# Patient Record
Sex: Female | Born: 1985 | Race: White | Hispanic: No | Marital: Married | Smoking: Never smoker
Health system: Southern US, Community
[De-identification: ages and names within clinical notes are randomized; demographics above are authoritative.]

## PROBLEM LIST (undated history)

## (undated) DIAGNOSIS — Z87442 Personal history of urinary calculi: Secondary | ICD-10-CM

## (undated) DIAGNOSIS — F419 Anxiety disorder, unspecified: Secondary | ICD-10-CM

## (undated) HISTORY — PX: OTHER SURGICAL HISTORY: SHX169

---

## 2006-01-05 ENCOUNTER — Other Ambulatory Visit: Admission: RE | Admit: 2006-01-05 | Discharge: 2006-01-05 | Payer: Self-pay | Admitting: Obstetrics and Gynecology

## 2006-11-27 ENCOUNTER — Emergency Department (HOSPITAL_COMMUNITY): Admission: EM | Admit: 2006-11-27 | Discharge: 2006-11-27 | Payer: Self-pay | Admitting: Emergency Medicine

## 2009-02-09 ENCOUNTER — Ambulatory Visit: Payer: Self-pay | Admitting: Internal Medicine

## 2019-11-26 ENCOUNTER — Emergency Department (HOSPITAL_COMMUNITY): Payer: BC Managed Care – PPO

## 2019-11-26 ENCOUNTER — Emergency Department (HOSPITAL_COMMUNITY)
Admission: EM | Admit: 2019-11-26 | Discharge: 2019-11-26 | Disposition: A | Discharge status: Home / Self Care | Payer: BC Managed Care – PPO | Attending: Emergency Medicine | Admitting: Emergency Medicine

## 2019-11-26 ENCOUNTER — Encounter (HOSPITAL_COMMUNITY): Payer: Self-pay | Admitting: Emergency Medicine

## 2019-11-26 ENCOUNTER — Other Ambulatory Visit: Payer: Self-pay

## 2019-11-26 DIAGNOSIS — Z20828 Contact with and (suspected) exposure to other viral communicable diseases: Secondary | ICD-10-CM | POA: Diagnosis not present

## 2019-11-26 DIAGNOSIS — R0789 Other chest pain: Secondary | ICD-10-CM | POA: Insufficient documentation

## 2019-11-26 DIAGNOSIS — R519 Headache, unspecified: Secondary | ICD-10-CM | POA: Diagnosis not present

## 2019-11-26 DIAGNOSIS — R079 Chest pain, unspecified: Secondary | ICD-10-CM | POA: Diagnosis not present

## 2019-11-26 LAB — BASIC METABOLIC PANEL
Anion gap: 11 (ref 5–15)
BUN: 13 mg/dL (ref 6–20)
CO2: 23 mmol/L (ref 22–32)
Calcium: 9.6 mg/dL (ref 8.9–10.3)
Chloride: 105 mmol/L (ref 98–111)
Creatinine, Ser: 0.74 mg/dL (ref 0.44–1.00)
GFR calc Af Amer: >60 mL/min (ref >60)
GFR calc non Af Amer: >60 mL/min (ref >60)
Glucose, Bld: 109 mg/dL — ABNORMAL HIGH (ref 70–99)
Potassium: 4.1 mmol/L (ref 3.5–5.1)
Sodium: 139 mmol/L (ref 135–145)

## 2019-11-26 LAB — TROPONIN I (HIGH SENSITIVITY)
Troponin I (High Sensitivity): <2 ng/L (ref <18)
Troponin I (High Sensitivity): <2 ng/L (ref <18)

## 2019-11-26 LAB — I-STAT BETA HCG BLOOD, ED (MC, WL, AP ONLY): I-stat hCG, quantitative: <5 m[IU]/mL (ref <5)

## 2019-11-26 LAB — CBC
HCT: 39.4 % (ref 36.0–46.0)
Hemoglobin: 13.9 g/dL (ref 12.0–15.0)
MCH: 29.6 pg (ref 26.0–34.0)
MCHC: 35.3 g/dL (ref 30.0–36.0)
MCV: 84 fL (ref 80.0–100.0)
Platelets: 270 10*3/uL (ref 150–400)
RBC: 4.69 MIL/uL (ref 3.87–5.11)
RDW: 11.9 % (ref 11.5–15.5)
WBC: 11.4 10*3/uL — ABNORMAL HIGH (ref 4.0–10.5)
nRBC: 0 % (ref 0.0–0.2)

## 2019-11-26 LAB — D-DIMER, QUANTITATIVE (NOT AT ARMC): D-Dimer, Quant: <0.27 ug/mL-FEU (ref 0.00–0.50)

## 2019-11-26 MED ORDER — SODIUM CHLORIDE 0.9% FLUSH: 3.0000 mL | Freq: Once | INTRAVENOUS | Status: DC

## 2019-11-26 MED ORDER — HYDROXYZINE HCL 25 MG PO TABS: 25.0000 mg | ORAL_TABLET | Freq: Four times a day (QID) | ORAL | 0 refills | Status: AC

## 2019-11-26 NOTE — Discharge Instructions (Addendum)
Please take medication as prescribed. Please follow up with neurology by calling to make appointment (may call today). I have also included some resources for therapy in your discharge paperwork. Please call to schedule an appointment if you would like. Please return immediately to ED for new or worsening or concerning symptoms.

## 2019-11-26 NOTE — ED Triage Notes (Signed)
Pt to ED with c/o mid chest pain radiating into left shoulder x's 4 days.  Pt denies any nausea or vomiting

## 2019-11-26 NOTE — ED Provider Notes (Signed)
[facility]   CSN: [id number] Arrival date & time: 11/26/19  0134     History   Chief Complaint Chief Complaint  Patient presents with   Chest Pain    HPI Shelbie Ammonsnnie Van Hoy Gatton is a 33 y.o. female      HPI  Patient is an otherwise healthy 33 year old female presenting today with intermittent chest pain that is described as sharp and tight with mild severity, states that it typically occurs more when she is sitting still when she is walking around.  But can happen anytime.  States it is worse with bending over.  Denies any cough congestion fevers.  Denies any shortness of breath.   Patient also endorses intermittent headaches as well.  States that she has had some moments of decreased sensation in her left arm.  Denies any vision changes, weakness, slurred speech, facial droop.  Denies any trauma to her head or neck.  Denies any medications, drugs, alcohol use.  Denies any fevers, confusion or neck stiffness.  Denies any decreased temperature or pain sensation.  Denies any electric shock sensation with neck flexion.  Denies any double vision.  Denies constipation, incontinence, or worsening of symptoms with heat.  Patient is a non-smoker, does endorse history of cardiac issues in family members at young age but is uncertain of specific age.  Denies any personal history of diabetes, hypertension, hyperlipidemia, obesity.  History reviewed. No pertinent past medical history.  There are no active problems to display for this patient.   History reviewed. No pertinent surgical history.   OB History   No obstetric history on file.      Home Medications    Prior to Admission medications   Medication Sig Start Date End Date Taking? Authorizing Provider  hydrOXYzine (ATARAX/VISTARIL) 25 MG tablet Take 1 tablet (25 mg total) by mouth every 6 (six) hours for 14 days. 11/26/19 12/10/19  Gailen ShelterFondaw, Devynne Sturdivant S, PA    Family History No  family history on file.  Social History Social History   Tobacco Use   Smoking status: Never Smoker   Smokeless tobacco: Never Used  Substance Use Topics   Alcohol use: Never    Frequency: Never   Drug use: Never     Allergies   Patient has no allergy information on record.   Review of Systems Review of Systems  Constitutional: Negative for chills and fever.  HENT: Negative for congestion.   Respiratory: Negative for chest tightness and shortness of breath.   Cardiovascular: Positive for chest pain.  Gastrointestinal: Negative for abdominal pain.  Genitourinary: Negative for frequency.  Musculoskeletal: Negative for back pain, neck pain and neck stiffness.  Neurological: Positive for headaches.       Paresthesias     Physical Exam Updated Vital Signs BP 115/81    Pulse 99    Temp 98.7 F (37.1 C) (Oral)    Resp 18    Ht 4\' 11"  (1.499 m)    Wt 65.8 kg    LMP 11/05/2019 (Approximate)    SpO2 98%    BMI 29.29 kg/m   Physical Exam Vitals signs and nursing note reviewed.  Constitutional:      General: She is not in acute distress.    Comments: Patient tearful in room during encounter.  Able to follow commands answers questions appropriately.  Alert and oriented x3  HENT:     Head: Normocephalic and atraumatic.     Nose: Nose normal.  Eyes:  General: No scleral icterus. Neck:     Musculoskeletal: Normal range of motion. No neck rigidity.  Cardiovascular:     Rate and Rhythm: Normal rate and regular rhythm.     Pulses: Normal pulses.     Heart sounds: Normal heart sounds.     Comments: Heart rate within normal limits Pulmonary:     Effort: Pulmonary effort is normal. No respiratory distress.     Breath sounds: No wheezing.  Abdominal:     Palpations: Abdomen is soft.     Tenderness: There is no abdominal tenderness.  Musculoskeletal:     Right lower leg: No edema.     Left lower leg: No edema.  Skin:    General: Skin is warm and dry.     Capillary  Refill: Capillary refill takes less than 2 seconds.  Neurological:     Mental Status: She is alert. Mental status is at baseline.     Comments: Alert and oriented to self, place, time and event.   Speech is fluent, clear without dysarthria or dysphasia.   Strength 5/5 in upper/lower extremities  Sensation intact in upper/lower extremities   Normal gait.  Negative Romberg. No pronator drift.  Normal finger-to-nose and feet tapping.  CN I not tested  CN II grossly intact visual fields bilaterally. Did not visualize posterior eye.   CN III, IV, VI PERRLA and EOMs intact bilaterally  CN V Intact sensation to sharp and light touch to the face  CN VII facial movements symmetric  CN VIII not tested  CN IX, X no uvula deviation, symmetric rise of soft palate  CN XI 5/5 SCM and trapezius strength bilaterally  CN XII Midline tongue protrusion, symmetric L/R movements   Psychiatric:        Thought Content: Thought content normal.        Judgment: Judgment normal.     Comments: Anxious and upset.      ED Treatments / Results  Labs (all labs ordered are listed, but only abnormal results are displayed) Labs Reviewed  BASIC METABOLIC PANEL - Abnormal; Notable for the following components:      Result Value   Glucose, Bld 109 (*)    All other components within normal limits  CBC - Abnormal; Notable for the following components:   WBC 11.4 (*)    All other components within normal limits  SARS CORONAVIRUS 2 (TAT 6-24 HRS)  D-DIMER, QUANTITATIVE (NOT AT [facility])  I-STAT BETA HCG BLOOD, ED ([place], [facility], [facility] ONLY)  TROPONIN I (HIGH SENSITIVITY)  TROPONIN I (HIGH SENSITIVITY)    EKG EKG Interpretation  Date/Time:  Wednesday November 26 2019 01:42:05 EST Ventricular Rate:  118 PR Interval:  128 QRS Duration: 74 QT Interval:  300 QTC Calculation: 420 R Axis:   70 Text Interpretation: Sinus tachycardia Otherwise normal ECG No old tracing to compare Confirmed by Deno Etienne [phone]) on  11/26/2019 8:27:18 AM   Radiology Dg Chest 2 View  Result Date: 11/26/2019 CLINICAL DATA:  Chest pain EXAM: CHEST - 2 VIEW COMPARISON:  None. FINDINGS: The heart size and mediastinal contours are within normal limits. Both lungs are clear. The visualized skeletal structures are unremarkable. IMPRESSION: No active cardiopulmonary disease. Electronically Signed   By: Constance Holster M.D.   On: 11/26/2019 02:08    Procedures Procedures (including critical care time)  Medications Ordered in ED Medications  sodium chloride flush (NS) 0.9 % injection 3 mL (has no administration in time range)  Initial Impression / Assessment and Plan / ED Course  I have reviewed the triage vital signs and the nursing notes.  Pertinent labs & imaging results that were available during my care of the patient were reviewed by me and considered in my medical decision making (see chart for details).        Patient presents with sternal chest pain radiating to left shoulder is initially presents to ED tachycardic and hypertensive however pulse and blood pressure normalized without any intervention before being assessed by EDP.   Patient with atypical chest pain that is intermittent.  She has normal EKG other than sinus tachycardia and troponins that are negative, less than 2, x2.  chest X-ray personally reviewed by me is without acute abnormality.   Bloodwork without acute abnormality. Mild leukocytosis that is unremarkable without left shift. D-dimer negative. Doubt PE, will not order CTA at this time. Doubt ACS as pt has low heart score and with non-ischemic EKG/trops WNLs.   Mild concern for neurologic cause of patient's symptoms versus psychologic stress related to recent job issues, home life and death of family dog. Will recommend FU with neurology as patient states she has not been able to get seen by family medicine recently and has an apt scheduled in march. Will prescribe atarax for anxiety and  recommend outpatient resources for therapy.   Gave strict return precautions. Will obtain OP covid swab.     I discussed this case with my attending physician who cosigned this note including patient's presenting symptoms, physical exam, and planned diagnostics and interventions. Attending physician stated agreement with plan or made changes to plan which were implemented.   This patient appears reasonably screened and I doubt any other medical condition requiring further workup, evaluation, or treatment in the ED at this time prior to discharge.   Patient's vitals are WNL apart from vital sign abnormalities discussed above, patient is in NAD, and able to ambulate in the ED at their baseline. Pain has been managed or a plan has been made for home management and has no complaints prior to discharge. Patient is comfortable with above plan and is stable for discharge at this time. All questions were answered prior to disposition. Results from the ER workup discussed with the patient face to face and all questions answered to the best of my ability. The patient is safe for discharge with strict return precautions. Patient appears safe for discharge with appropriate follow-up. Conveyed my impression with the patient and they voiced understanding and are agreeable to plan.   An After Visit Summary was printed and given to the patient.  Portions of this note were generated with Scientist, clinical (histocompatibility and immunogenetics). Dictation errors may occur despite best attempts at proofreading.    Final Clinical Impressions(s) / ED Diagnoses   Final diagnoses:  Atypical chest pain    ED Discharge Orders         Ordered    hydrOXYzine (ATARAX/VISTARIL) 25 MG tablet  Every 6 hours     11/26/19 0852           Gailen Shelter, PA 11/26/19 0853    Melene Plan, DO 11/26/19 [phone]

## 2019-12-31 DIAGNOSIS — F430 Acute stress reaction: Secondary | ICD-10-CM | POA: Diagnosis not present

## 2019-12-31 DIAGNOSIS — E663 Overweight: Secondary | ICD-10-CM | POA: Diagnosis not present

## 2020-02-24 DIAGNOSIS — E663 Overweight: Secondary | ICD-10-CM | POA: Diagnosis not present

## 2020-02-24 DIAGNOSIS — R7989 Other specified abnormal findings of blood chemistry: Secondary | ICD-10-CM | POA: Diagnosis not present

## 2020-02-24 DIAGNOSIS — Z0000 Encounter for general adult medical examination without abnormal findings: Secondary | ICD-10-CM | POA: Diagnosis not present

## 2020-03-02 DIAGNOSIS — Z0000 Encounter for general adult medical examination without abnormal findings: Secondary | ICD-10-CM | POA: Diagnosis not present

## 2020-03-02 DIAGNOSIS — Z1331 Encounter for screening for depression: Secondary | ICD-10-CM | POA: Diagnosis not present

## 2021-07-25 ENCOUNTER — Other Ambulatory Visit: Payer: Self-pay

## 2021-07-25 ENCOUNTER — Emergency Department (HOSPITAL_COMMUNITY)
Admission: EM | Admit: 2021-07-25 | Discharge: 2021-07-26 | Disposition: A | Discharge status: Home / Self Care | Payer: BC Managed Care – PPO | Attending: Emergency Medicine | Admitting: Emergency Medicine

## 2021-07-25 ENCOUNTER — Encounter (HOSPITAL_COMMUNITY): Payer: Self-pay

## 2021-07-25 DIAGNOSIS — N201 Calculus of ureter: Secondary | ICD-10-CM | POA: Insufficient documentation

## 2021-07-25 DIAGNOSIS — R112 Nausea with vomiting, unspecified: Secondary | ICD-10-CM | POA: Diagnosis not present

## 2021-07-25 DIAGNOSIS — N132 Hydronephrosis with renal and ureteral calculous obstruction: Secondary | ICD-10-CM | POA: Diagnosis not present

## 2021-07-25 NOTE — ED Triage Notes (Signed)
Pt reports LUQ pain under her ribcage with n/v. Pain is worse after eating.

## 2021-07-25 NOTE — ED Notes (Signed)
Called patient for VS, no answer x3.

## 2021-07-26 ENCOUNTER — Telehealth (HOSPITAL_COMMUNITY): Payer: Self-pay | Admitting: Emergency Medicine

## 2021-07-26 ENCOUNTER — Emergency Department (HOSPITAL_COMMUNITY): Payer: BC Managed Care – PPO

## 2021-07-26 DIAGNOSIS — N132 Hydronephrosis with renal and ureteral calculous obstruction: Secondary | ICD-10-CM | POA: Diagnosis not present

## 2021-07-26 DIAGNOSIS — N201 Calculus of ureter: Secondary | ICD-10-CM | POA: Diagnosis not present

## 2021-07-26 LAB — URINALYSIS, ROUTINE W REFLEX MICROSCOPIC
Bilirubin Urine: NEGATIVE
Glucose, UA: NEGATIVE mg/dL
Hgb urine dipstick: NEGATIVE
Ketones, ur: 80 mg/dL — AB
Leukocytes,Ua: NEGATIVE
Nitrite: NEGATIVE
Protein, ur: NEGATIVE mg/dL
Specific Gravity, Urine: 1.021 (ref 1.005–1.030)
pH: 5 (ref 5.0–8.0)

## 2021-07-26 LAB — CBC WITH DIFFERENTIAL/PLATELET
Abs Immature Granulocytes: 0.11 10*3/uL — ABNORMAL HIGH (ref 0.00–0.07)
Basophils Absolute: 0 10*3/uL (ref 0.0–0.1)
Basophils Relative: 0 %
Eosinophils Absolute: 0 10*3/uL (ref 0.0–0.5)
Eosinophils Relative: 0 %
HCT: 41.5 % (ref 36.0–46.0)
Hemoglobin: 14.6 g/dL (ref 12.0–15.0)
Immature Granulocytes: 1 %
Lymphocytes Relative: 5 %
Lymphs Abs: 1.2 10*3/uL (ref 0.7–4.0)
MCH: 30.4 pg (ref 26.0–34.0)
MCHC: 35.2 g/dL (ref 30.0–36.0)
MCV: 86.3 fL (ref 80.0–100.0)
Monocytes Absolute: 1.1 10*3/uL — ABNORMAL HIGH (ref 0.1–1.0)
Monocytes Relative: 5 %
Neutro Abs: 20.2 10*3/uL — ABNORMAL HIGH (ref 1.7–7.7)
Neutrophils Relative %: 89 %
Platelets: 262 10*3/uL (ref 150–400)
RBC: 4.81 MIL/uL (ref 3.87–5.11)
RDW: 11.5 % (ref 11.5–15.5)
WBC: 22.6 10*3/uL — ABNORMAL HIGH (ref 4.0–10.5)
nRBC: 0 % (ref 0.0–0.2)

## 2021-07-26 LAB — COMPREHENSIVE METABOLIC PANEL
ALT: 13 U/L (ref 0–44)
AST: 22 U/L (ref 15–41)
Albumin: 4.6 g/dL (ref 3.5–5.0)
Alkaline Phosphatase: 47 U/L (ref 38–126)
Anion gap: 12 (ref 5–15)
BUN: 9 mg/dL (ref 6–20)
CO2: 22 mmol/L (ref 22–32)
Calcium: 9.6 mg/dL (ref 8.9–10.3)
Chloride: 100 mmol/L (ref 98–111)
Creatinine, Ser: 1.04 mg/dL — ABNORMAL HIGH (ref 0.44–1.00)
GFR, Estimated: >60 mL/min (ref >60)
Glucose, Bld: 134 mg/dL — ABNORMAL HIGH (ref 70–99)
Potassium: 3.9 mmol/L (ref 3.5–5.1)
Sodium: 134 mmol/L — ABNORMAL LOW (ref 135–145)
Total Bilirubin: 0.5 mg/dL (ref 0.3–1.2)
Total Protein: 7.1 g/dL (ref 6.5–8.1)

## 2021-07-26 LAB — I-STAT BETA HCG BLOOD, ED (MC, WL, AP ONLY): I-stat hCG, quantitative: <5 m[IU]/mL (ref <5)

## 2021-07-26 MED ORDER — ONDANSETRON 4 MG PO TBDP: 4.0000 mg | ORAL_TABLET | Freq: Three times a day (TID) | ORAL | 0 refills | Status: AC | PRN

## 2021-07-26 MED ORDER — KETOROLAC TROMETHAMINE 30 MG/ML IJ SOLN
30.0000 mg | Freq: Once | INTRAMUSCULAR | Status: AC
  Administered 2021-07-26: 30 mg via INTRAVENOUS
  Filled 2021-07-26: qty 1

## 2021-07-26 MED ORDER — HYDROMORPHONE HCL 1 MG/ML IJ SOLN
0.5000 mg | Freq: Once | INTRAMUSCULAR | Status: AC
  Administered 2021-07-26: 0.5 mg via INTRAVENOUS
  Filled 2021-07-26: qty 1

## 2021-07-26 MED ORDER — FENTANYL CITRATE (PF) 100 MCG/2ML IJ SOLN
50.0000 ug | Freq: Once | INTRAMUSCULAR | Status: AC
  Administered 2021-07-26: 50 ug via INTRAVENOUS
  Filled 2021-07-26: qty 2

## 2021-07-26 MED ORDER — ONDANSETRON 4 MG PO TBDP: 4.0000 mg | ORAL_TABLET | Freq: Three times a day (TID) | ORAL | 0 refills | Status: DC | PRN

## 2021-07-26 MED ORDER — SODIUM CHLORIDE 0.9 % IV BOLUS
1000.0000 mL | Freq: Once | INTRAVENOUS | Status: AC
  Administered 2021-07-26: 1000 mL via INTRAVENOUS

## 2021-07-26 MED ORDER — ONDANSETRON HCL 4 MG/2ML IJ SOLN
4.0000 mg | Freq: Once | INTRAMUSCULAR | Status: AC
  Administered 2021-07-26: 4 mg via INTRAVENOUS
  Filled 2021-07-26: qty 2

## 2021-07-26 MED ORDER — HYDROMORPHONE HCL 1 MG/ML IJ SOLN
1.0000 mg | Freq: Once | INTRAMUSCULAR | Status: AC
  Administered 2021-07-26: 1 mg via INTRAVENOUS
  Filled 2021-07-26: qty 1

## 2021-07-26 MED ORDER — OXYCODONE-ACETAMINOPHEN 5-325 MG PO TABS: 1.0000 | ORAL_TABLET | Freq: Four times a day (QID) | ORAL | 0 refills | Status: DC | PRN

## 2021-07-26 MED ORDER — OXYCODONE-ACETAMINOPHEN 5-325 MG PO TABS: 1.0000 | ORAL_TABLET | Freq: Four times a day (QID) | ORAL | 0 refills | Status: AC | PRN

## 2021-07-26 NOTE — ED Provider Notes (Signed)
[facility] EMERGENCY DEPARTMENT Provider Note   CSN: [id number] Arrival date & time: 07/25/21  1559     History Chief Complaint  Patient presents with   Abdominal Pain    Ruth Farmer is a 35 y.o. female.  Patient to ED for evaluation of left sided abdominal pain that started as mild intermittent pain more than a week ago. Yesterday, the pain became constant, severe and associated with nausea and vomiting. No fever at any time. No hematuria. She has a history of kidney stones with pain similar to constant pain that started yesterday. No diarrhea. No chest pain or SOB.   The history is provided by the patient. No language interpreter was used.  Abdominal Pain Associated symptoms: nausea and vomiting   Associated symptoms: no chills, no diarrhea, no dysuria, no fever and no hematuria       History reviewed. No pertinent past medical history.  There are no problems to display for this patient.   History reviewed. No pertinent surgical history.   OB History   No obstetric history on file.     No family history on file.  Social History   Tobacco Use   Smoking status: Never   Smokeless tobacco: Never  Substance Use Topics   Alcohol use: Never   Drug use: Never    Home Medications Prior to Admission medications   Not on File    Allergies    Patient has no allergy information on record.  Review of Systems   Review of Systems  Constitutional:  Negative for chills and fever.  Respiratory: Negative.    Cardiovascular: Negative.   Gastrointestinal:  Positive for abdominal pain, nausea and vomiting. Negative for diarrhea.  Genitourinary:  Positive for flank pain. Negative for dysuria and hematuria.  Skin: Negative.   Neurological: Negative.    Physical Exam Updated Vital Signs BP 118/75 (BP Location: Right Arm)   Pulse 85   Temp 98.6 F (37 C)   Resp 18   LMP 07/12/2021   SpO2 98%   Physical Exam Vitals and nursing note  reviewed.  Constitutional:      Appearance: She is well-developed.  HENT:     Head: Normocephalic.  Cardiovascular:     Rate and Rhythm: Normal rate and regular rhythm.     Heart sounds: No murmur heard. Pulmonary:     Effort: Pulmonary effort is normal.     Breath sounds: Normal breath sounds. No wheezing, rhonchi or rales.  Abdominal:     General: Bowel sounds are normal.     Palpations: Abdomen is soft.     Tenderness: There is abdominal tenderness in the left upper quadrant and left lower quadrant. There is no left CVA tenderness, guarding or rebound.  Musculoskeletal:        General: Normal range of motion.     Cervical back: Normal range of motion and neck supple.  Skin:    General: Skin is warm and dry.  Neurological:     General: No focal deficit present.     Mental Status: She is alert and oriented to person, place, and time.    ED Results / Procedures / Treatments   Labs (all labs ordered are listed, but only abnormal results are displayed) Labs Reviewed - No data to display  EKG None  Radiology No results found.  Procedures Procedures   Medications Ordered in ED Medications - No data to display  ED Course  I have reviewed the  triage vital signs and the nursing notes.  Pertinent labs & imaging results that were available during my care of the patient were reviewed by me and considered in my medical decision making (see chart for details).    MDM Rules/Calculators/A&P                           Patient with h/o stones to ED with left sided abdominal pain x >1 week, now constant, severe and associated with nausea and vomiting. No fever at any time.   Appears uncomfortable. UA pending. Leukocytosis of 22.6. Will start IV and provide fluids, pain and nausea control. CT renal ordered.   CT renal shows proximal 4 mm stone, perinephric stranding on left, mild hydro.   No infection indicated without fever, negative UA. Pain uncontrolled at this point. Will  provide more medication and reassess.   Pain controlled with Toradol and additional 0.5 mg dilaudid. She is ready for discharge. Will refer to urology. Return precautions discussed.  Final Clinical Impression(s) / ED Diagnoses Final diagnoses:  None   Ureterolithiasis   Rx / DC Orders ED Discharge Orders     None        Danne Harbor 07/26/21 0720    Dione Booze, MD 07/26/21 2244

## 2021-07-26 NOTE — Discharge Instructions (Addendum)
Follow up with [facility] for further management of passing kidney stone  If you develop a fever, have uncontrolled pain or vomiting, return to the emergency department. The ED at [facility] is recommended as if an urgent urologic procedure is needed that will be the preferred hospital.

## 2021-07-26 NOTE — ED Notes (Signed)
Patient verbalizes understanding of discharge instructions. Opportunity for questioning and answers were provided. Armband removed by staff, pt discharged from ED via wheelchair to lobby to go home with SO.

## 2021-07-26 NOTE — ED Notes (Signed)
Pt ambulated to RR with stead gait

## 2021-07-28 DIAGNOSIS — N201 Calculus of ureter: Secondary | ICD-10-CM | POA: Diagnosis not present

## 2021-07-29 ENCOUNTER — Other Ambulatory Visit: Payer: Self-pay | Admitting: Urology

## 2021-08-01 ENCOUNTER — Other Ambulatory Visit: Payer: Self-pay

## 2021-08-01 ENCOUNTER — Encounter (HOSPITAL_BASED_OUTPATIENT_CLINIC_OR_DEPARTMENT_OTHER): Payer: Self-pay | Admitting: Urology

## 2021-08-01 DIAGNOSIS — Z973 Presence of spectacles and contact lenses: Secondary | ICD-10-CM

## 2021-08-01 HISTORY — DX: Presence of spectacles and contact lenses: Z97.3

## 2021-08-01 NOTE — H&P (Signed)
I have kidney stones.  HPI: Ruth Farmer is a 35 year-old female patient who is here for renal calculi.    rib cage pain     CC/HPI: Patient is a 35 year old white female with no prior history of stones. Presented to the [facility] emergency room on 07/27/2019 who with acute onset left-sided flank pain with nausea and vomiting. She was found on CT scan of a 4 mm left proximal ureteral calculus with hydronephrosis. She was not placed on tamsulosin both given pain medicines and nausea medicine he is now here for follow-up. She has continued to have severe pain at times currently about 7/10. She has had some nausea and vomiting but no fever or gross hematuria.     ALLERGIES: None   MEDICATIONS: Claritin  Elderberry Zinc  Ondansetron Hcl 4 mg tablet  Oxycodone-Acetaminophen  Prenatal     GU PSH: No GU PSH      PSH Notes: wisdom tooth removed   NON-GU PSH: No Non-GU PSH    GU PMH: None   NON-GU PMH: Anxiety    FAMILY HISTORY: Cancer - Father   SOCIAL HISTORY: Marital Status: Married Preferred Language: English; Ethnicity: Not Hispanic Or Latino; Race: White Current Smoking Status: Patient has never smoked.   Tobacco Use Assessment Completed: Used Tobacco in last 30 days? Does not use smokeless tobacco. Does not use drugs. Drinks 2 caffeinated drinks per day. Has not had a blood transfusion.    REVIEW OF SYSTEMS:    GU Review Female:   Patient reports frequent urination and hard to postpone urination. Patient denies burning /pain with urination, get up at night to urinate, leakage of urine, stream starts and stops, trouble starting your stream, have to strain to urinate, and being pregnant.  Gastrointestinal (Upper):   Patient reports nausea and vomiting. Patient denies indigestion/ heartburn.  Gastrointestinal (Lower):   Patient denies diarrhea and constipation.  Constitutional:   Patient denies fever, night sweats, weight loss, and fatigue.  Skin:   Patient denies  skin rash/ lesion and itching.  Eyes:   Patient denies blurred vision and double vision.  Ears/ Nose/ Throat:   Patient denies sore throat and sinus problems.  Hematologic/Lymphatic:   Patient reports easy bruising. Patient denies swollen glands.  Cardiovascular:   Patient denies leg swelling and chest pains.  Respiratory:   Patient denies cough and shortness of breath.  Endocrine:   Patient denies excessive thirst.  Musculoskeletal:   Patient reports back pain. Patient denies joint pain.  Neurological:   Patient denies headaches and dizziness.  Psychologic:   Patient reports anxiety. Patient denies depression.   Notes: uti    VITAL SIGNS:      07/28/2021 11:37 AM  Height 55 in / 139.7 cm  BP 121/80 mmHg  Pulse 91 /min  Temperature 98.0 F / 36.6 C   MULTI-SYSTEM PHYSICAL EXAMINATION:    Constitutional: Well-nourished white female in obvious discomfort.   Neck: Neck symmetrical, not swollen. Normal tracheal position.  Respiratory: No labored breathing, no use of accessory muscles.   Cardiovascular: Normal temperature, normal extremity pulses, no swelling, no varicosities.  Lymphatic: No enlargement of neck, axillae, groin.  Skin: No paleness, no jaundice, no cyanosis. No lesion, no ulcer, no rash.  Neurologic / Psychiatric: Oriented to time, oriented to place, oriented to person. No depression, no anxiety, no agitation.  Gastrointestinal: No mass, no tenderness, no rigidity, non obese abdomen. Patient has mild-to-moderate left CVA tenderness and mild left lower quadrant tenderness without rebound  tenderness.  Eyes: Normal conjunctivae. Normal eyelids.  Ears, Nose, Mouth, and Throat: Left ear no scars, no lesions, no masses. Right ear no scars, no lesions, no masses. Nose no scars, no lesions, no masses. Normal hearing. Normal lips.  Musculoskeletal: Normal gait and station of head and neck.     Complexity of Data:  Source Of History:  Patient  Records Review:   Previous Doctor  Records, Previous Hospital Records  Urine Test Review:   Urinalysis  X-Ray Review: KUB: Reviewed Films. Reviewed Report. Discussed With Patient.  C.T. Abdomen/Pelvis: Reviewed Films. Reviewed Report. Discussed With Patient.     PROCEDURES:         KUB - [id number]  A single view of the abdomen is obtained.      . Patient confirmed No Neulasta OnPro Device. KUB is reviewed today shows no obvious bony abnormalities. She has diffuse bowel gas pattern which makes visualization of stones difficult in the region of the kidneys and upper ureter. There are some calcifications in the pelvis and correlating with the CT pre scan I think there is an additional calcification now on the KUB which may suggest interval distal migration of the left ureteral calculus.          Urinalysis w/Scope - 81001 Dipstick Dipstick Cont'd Micro  Color: Yellow Bilirubin: Neg mg/dL WBC/hpf: 0 - 5/hpf  Appearance: Clear Ketones: 2+ mg/dL RBC/hpf: 0 - 2/hpf  Specific Gravity: 1.025 Blood: 2+ ery/uL Bacteria: NS (Not Seen)  pH: 5.5 Protein: Trace mg/dL Cystals: NS (Not Seen)  Glucose: Neg mg/dL Urobilinogen: 0.2 mg/dL Casts: NS (Not Seen)    Nitrites: Neg Trichomonas: Not Present    Leukocyte Esterase: Neg leu/uL Mucous: Not Present      Epithelial Cells: 0 - 5/hpf      Yeast: Few (1 - 5/hpf)      Sperm: Not Present    Notes: microscopic not concentrated          Ketoralac 60mg  - , [id number] area was prepped and IM ij was giving.pt.tolerated the IJ well.   Qty: 60 Adm. By: [id number]  Unit: mg Lot No Cathe Mons  Route: IM Exp. Date 07/09/2021  Freq: None Mfgr.:   Site: Right Buttock   ASSESSMENT:      ICD-10 Details  1 GU:   Ureteral calculus - N20.1 Acute, Complicated Injury   PLAN:            Medications New Meds: Tamsulosin Hcl 0.4 mg capsule 1 capsule PO Q HS   #15  0 Refill(s)  Oxycodone-Acetaminophen 5 mg-325 mg tablet 1 tablet PO Q 6 H   #15  0 Refill(s)            Orders X-Rays: KUB           Schedule         Document Letter(s):  Created for Patient: Clinical Summary         Notes:   We discussed management of probable left distal ureteral calculus. Discussed possibility of in situ lithotripsy versus ureteroscopy but I think ureteroscopy would be best due best immediate outcome. I am going to schedule accordingly early next week for left ureteroscopy laser lithotripsy. In the meantime initiate tamsulosin as medical expulsive therapy at 0.4 mg daily. Will give IM shot of Toradol 60 mg today. Will renew passed some Percocet and she has nausea medicine. She will let 07/11/2021 know if she has worsening pain nausea vomiting or fever in the interim. We discussed  risks and benefits of ureteroscopy and laser lithotripsy in detail as outlined below and she is agreeable to proceed if she does not pass the stone in the interim.  I have recommended retrograde pyelogram, ureteroscopic stone manipulation with laser lithotripsy. I have discussed in detail the risks, benefits and alternatives of ureteroscopic stone extraction to include but not limited to: Bleeding, infection, ureteral perforation with need for open repair, inability to place the stent necessitating the need for further procedures, possible percutaneous nephrostomy tube placement, discomfort from the stents, hematuria, urgency, frequency and refractory problems after the stent is removed. I discussed the stent is not a permanent stent and will require a followup for stent removal or stent exchange. The patient knows there is high risk for ureteral stent incrustation if this is not removed or exchanged within 3 months. Patient voices understanding of the risks and benefits of the procedure and consents to the procedure.

## 2021-08-01 NOTE — Progress Notes (Signed)
Spoke w/ via phone for pre-op interview---pt Lab needs dos--urine poct--               Lab results------cbc with dif, cmp ua 07-26-2021 epic COVID test -----patient states asymptomatic no test needed Arrive at -------815 am 08-02-2021 NPO after MN NO Solid Food.  Clear liquids from MN until---715 am then npo Med rec completed Medications to take morning of surgery -----ondanestron prn, oxycodone prn, loratadine prn Diabetic medication -----n/a Patient instructed no nail polish to be worn day of surgery Patient instructed to bring photo id and insurance card day of surgery Patient aware to have Driver (ride ) / caregiver   spouse Barbara Cower  for 24 hours after surgery  Patient Special Instructions -----none Pre-Op special Istructions -----none Patient verbalized understanding of instructions that were given at this phone interview. Patient denies shortness of breath, chest pain, fever, cough at this phone interview.

## 2021-08-02 ENCOUNTER — Encounter (HOSPITAL_BASED_OUTPATIENT_CLINIC_OR_DEPARTMENT_OTHER): Payer: Self-pay | Admitting: Urology

## 2021-08-02 ENCOUNTER — Encounter (HOSPITAL_BASED_OUTPATIENT_CLINIC_OR_DEPARTMENT_OTHER)
Admission: RE | Disposition: A | Discharge status: Home / Self Care | Payer: Self-pay | Source: Home / Self Care | Attending: Urology

## 2021-08-02 ENCOUNTER — Ambulatory Visit (HOSPITAL_BASED_OUTPATIENT_CLINIC_OR_DEPARTMENT_OTHER)
Admission: RE | Admit: 2021-08-02 | Discharge: 2021-08-02 | Disposition: A | Discharge status: Home / Self Care | Payer: BC Managed Care – PPO | Attending: Urology | Admitting: Urology

## 2021-08-02 ENCOUNTER — Ambulatory Visit (HOSPITAL_BASED_OUTPATIENT_CLINIC_OR_DEPARTMENT_OTHER): Payer: BC Managed Care – PPO | Admitting: Anesthesiology

## 2021-08-02 DIAGNOSIS — N132 Hydronephrosis with renal and ureteral calculous obstruction: Secondary | ICD-10-CM | POA: Diagnosis not present

## 2021-08-02 DIAGNOSIS — F419 Anxiety disorder, unspecified: Secondary | ICD-10-CM | POA: Diagnosis not present

## 2021-08-02 DIAGNOSIS — N2889 Other specified disorders of kidney and ureter: Secondary | ICD-10-CM | POA: Diagnosis not present

## 2021-08-02 DIAGNOSIS — N201 Calculus of ureter: Secondary | ICD-10-CM | POA: Diagnosis not present

## 2021-08-02 HISTORY — DX: Anxiety disorder, unspecified: F41.9

## 2021-08-02 HISTORY — PX: CYSTOSCOPY/URETEROSCOPY/HOLMIUM LASER/STENT PLACEMENT: SHX6546

## 2021-08-02 HISTORY — DX: Personal history of urinary calculi: Z87.442

## 2021-08-02 LAB — POCT PREGNANCY, URINE: Preg Test, Ur: NEGATIVE

## 2021-08-02 SURGERY — CYSTOSCOPY/URETEROSCOPY/HOLMIUM LASER/STENT PLACEMENT
Anesthesia: General | Site: Ureter | Laterality: Left

## 2021-08-02 MED ORDER — PROPOFOL 10 MG/ML IV BOLUS
INTRAVENOUS | Status: DC | PRN
  Administered 2021-08-02: 160 mg via INTRAVENOUS

## 2021-08-02 MED ORDER — FENTANYL CITRATE (PF) 100 MCG/2ML IJ SOLN
INTRAMUSCULAR | Status: AC
  Filled 2021-08-02: qty 2

## 2021-08-02 MED ORDER — KETOROLAC TROMETHAMINE 30 MG/ML IJ SOLN
INTRAMUSCULAR | Status: DC | PRN
  Administered 2021-08-02: 30 mg via INTRAVENOUS

## 2021-08-02 MED ORDER — PROMETHAZINE HCL 25 MG/ML IJ SOLN: 6.2500 mg | INTRAMUSCULAR | Status: DC | PRN

## 2021-08-02 MED ORDER — OXYCODONE HCL 5 MG/5ML PO SOLN: 5.0000 mg | Freq: Once | ORAL | Status: DC | PRN

## 2021-08-02 MED ORDER — LACTATED RINGERS IV SOLN
INTRAVENOUS | Status: DC
  Administered 2021-08-02: 1000 mL via INTRAVENOUS

## 2021-08-02 MED ORDER — TAMSULOSIN HCL 0.4 MG PO CAPS: 0.4000 mg | ORAL_CAPSULE | Freq: Every day | ORAL | 1 refills | Status: AC

## 2021-08-02 MED ORDER — ACETAMINOPHEN 325 MG PO TABS: 325.0000 mg | ORAL_TABLET | ORAL | Status: DC | PRN

## 2021-08-02 MED ORDER — MIDAZOLAM HCL 5 MG/5ML IJ SOLN
INTRAMUSCULAR | Status: DC | PRN
  Administered 2021-08-02: 2 mg via INTRAVENOUS

## 2021-08-02 MED ORDER — SODIUM CHLORIDE 0.9 % IR SOLN
Status: DC | PRN
  Administered 2021-08-02: 3000 mL via INTRAVESICAL

## 2021-08-02 MED ORDER — CEFAZOLIN SODIUM-DEXTROSE 2-4 GM/100ML-% IV SOLN
2.0000 g | INTRAVENOUS | Status: AC
  Administered 2021-08-02: 2 g via INTRAVENOUS

## 2021-08-02 MED ORDER — DEXAMETHASONE SODIUM PHOSPHATE 10 MG/ML IJ SOLN
INTRAMUSCULAR | Status: DC | PRN
  Administered 2021-08-02: 10 mg via INTRAVENOUS

## 2021-08-02 MED ORDER — LIDOCAINE HCL (PF) 2 % IJ SOLN
INTRAMUSCULAR | Status: AC
  Filled 2021-08-02: qty 5

## 2021-08-02 MED ORDER — KETOROLAC TROMETHAMINE 30 MG/ML IJ SOLN
INTRAMUSCULAR | Status: AC
  Filled 2021-08-02: qty 1

## 2021-08-02 MED ORDER — ACETAMINOPHEN 160 MG/5ML PO SOLN: 325.0000 mg | ORAL | Status: DC | PRN

## 2021-08-02 MED ORDER — SCOPOLAMINE 1 MG/3DAYS TD PT72: 1.0000 | MEDICATED_PATCH | TRANSDERMAL | Status: DC

## 2021-08-02 MED ORDER — ONDANSETRON HCL 4 MG/2ML IJ SOLN
INTRAMUSCULAR | Status: DC | PRN
  Administered 2021-08-02: 4 mg via INTRAVENOUS

## 2021-08-02 MED ORDER — MIDAZOLAM HCL 2 MG/2ML IJ SOLN
INTRAMUSCULAR | Status: AC
  Filled 2021-08-02: qty 2

## 2021-08-02 MED ORDER — ONDANSETRON HCL 4 MG/2ML IJ SOLN
INTRAMUSCULAR | Status: AC
  Filled 2021-08-02: qty 2

## 2021-08-02 MED ORDER — PROPOFOL 10 MG/ML IV BOLUS
INTRAVENOUS | Status: AC
  Filled 2021-08-02: qty 20

## 2021-08-02 MED ORDER — OXYCODONE HCL 5 MG PO TABS: 5.0000 mg | ORAL_TABLET | Freq: Once | ORAL | Status: DC | PRN

## 2021-08-02 MED ORDER — FENTANYL CITRATE (PF) 100 MCG/2ML IJ SOLN
INTRAMUSCULAR | Status: DC | PRN
  Administered 2021-08-02: 25 ug via INTRAVENOUS
  Administered 2021-08-02: 50 ug via INTRAVENOUS
  Administered 2021-08-02: 25 ug via INTRAVENOUS

## 2021-08-02 MED ORDER — LIDOCAINE 2% (20 MG/ML) 5 ML SYRINGE
INTRAMUSCULAR | Status: DC | PRN
  Administered 2021-08-02: 60 mg via INTRAVENOUS

## 2021-08-02 MED ORDER — CEFAZOLIN SODIUM-DEXTROSE 2-4 GM/100ML-% IV SOLN
INTRAVENOUS | Status: AC
  Filled 2021-08-02: qty 100

## 2021-08-02 MED ORDER — FENTANYL CITRATE (PF) 100 MCG/2ML IJ SOLN: 25.0000 ug | INTRAMUSCULAR | Status: DC | PRN

## 2021-08-02 MED ORDER — DEXAMETHASONE SODIUM PHOSPHATE 10 MG/ML IJ SOLN
INTRAMUSCULAR | Status: AC
  Filled 2021-08-02: qty 1

## 2021-08-02 MED ORDER — ACETAMINOPHEN 10 MG/ML IV SOLN: 1000.0000 mg | Freq: Once | INTRAVENOUS | Status: DC | PRN

## 2021-08-02 MED ORDER — IOHEXOL 300 MG/ML  SOLN
INTRAMUSCULAR | Status: DC | PRN
  Administered 2021-08-02: 10 mL

## 2021-08-02 MED ORDER — AMISULPRIDE (ANTIEMETIC) 5 MG/2ML IV SOLN: 10.0000 mg | Freq: Once | INTRAVENOUS | Status: DC | PRN

## 2021-08-02 SURGICAL SUPPLY — 27 items
BAG DRAIN URO-CYSTO SKYTR STRL (DRAIN) ×2 IMPLANT
BAG DRN UROCATH (DRAIN) ×1
BASKET STONE 1.7 NGAGE (UROLOGICAL SUPPLIES) IMPLANT
BULB IRRIG PATHFIND (MISCELLANEOUS) ×2 IMPLANT
CATH URET 5FR 28IN OPEN ENDED (CATHETERS) ×2 IMPLANT
CLOTH BEACON ORANGE TIMEOUT ST (SAFETY) ×2 IMPLANT
EXTRACTOR STONE 1.7FRX115CM (UROLOGICAL SUPPLIES) ×2 IMPLANT
FIBER LASER FLEXIVA 365 (UROLOGICAL SUPPLIES) IMPLANT
GLOVE SURG ENC MOIS LTX SZ7.5 (GLOVE) ×2 IMPLANT
GOWN STRL REUS W/TWL XL LVL3 (GOWN DISPOSABLE) ×2 IMPLANT
GUIDEWIRE STR DUAL SENSOR (WIRE) ×2 IMPLANT
GUIDEWIRE ZIPWRE .038 STRAIGHT (WIRE) IMPLANT
IV NS 1000ML (IV SOLUTION) ×2
IV NS 1000ML BAXH (IV SOLUTION) ×1 IMPLANT
IV NS IRRIG 3000ML ARTHROMATIC (IV SOLUTION) ×2 IMPLANT
KIT TURNOVER CYSTO (KITS) ×2 IMPLANT
MANIFOLD NEPTUNE II (INSTRUMENTS) ×2 IMPLANT
NS IRRIG 500ML POUR BTL (IV SOLUTION) ×2 IMPLANT
PACK CYSTO (CUSTOM PROCEDURE TRAY) ×2 IMPLANT
SHEATH URETERAL 12FRX28CM (UROLOGICAL SUPPLIES) ×2 IMPLANT
STENT URET 6FRX26 CONTOUR (STENTS) ×2 IMPLANT
SYR 10ML LL (SYRINGE) ×2 IMPLANT
SYR 20ML LL LF (SYRINGE) ×2 IMPLANT
TRACTIP FLEXIVA PULS ID 200XHI (Laser) ×1 IMPLANT
TRACTIP FLEXIVA PULSE ID 200 (Laser) ×2
TUBE CONNECTING 12X1/4 (SUCTIONS) IMPLANT
TUBING UROLOGY SET (TUBING) ×2 IMPLANT

## 2021-08-02 NOTE — Anesthesia Postprocedure Evaluation (Signed)
Anesthesia Post Note  Patient: Ruth Farmer  Procedure(s) Performed: CYSTOSCOPY/URETEROSCOPY/HOLMIUM LASER/STENT PLACEMENT (Left: Ureter)     Patient location during evaluation: PACU Anesthesia Type: General Level of consciousness: awake and alert Pain management: pain level controlled Vital Signs Assessment: post-procedure vital signs reviewed and stable Respiratory status: spontaneous breathing, nonlabored ventilation, respiratory function stable and patient connected to nasal cannula oxygen Cardiovascular status: blood pressure returned to baseline and stable Postop Assessment: no apparent nausea or vomiting Anesthetic complications: no   No notable events documented.  Last Vitals:  Vitals:   08/02/21 1144 08/02/21 1145  BP: 124/87 124/87  Pulse: 70 72  Resp: 16 13  Temp:    SpO2: 96% 96%    Last Pain:  Vitals:   08/02/21 1144  TempSrc:   PainSc: 2                  Shelton Silvas

## 2021-08-02 NOTE — Interval H&P Note (Signed)
History and Physical Interval Note:  08/02/2021 10:00 AM  Ruth Farmer  has presented today for surgery, with the diagnosis of LEFT URETERAL CALCULUS.  The various methods of treatment have been discussed with the patient and family. After consideration of risks, benefits and other options for treatment, the patient has consented to  Procedure(s) with comments: CYSTOSCOPY/URETEROSCOPY/HOLMIUM LASER/STENT PLACEMENT (Left) - 45 MINS as a surgical intervention.  The patient's history has been reviewed, patient examined, no change in status, stable for surgery.  I have reviewed the patient's chart and labs.  Questions were answered to the patient's satisfaction.     Belva Agee

## 2021-08-02 NOTE — Anesthesia Procedure Notes (Signed)
Procedure Name: LMA Insertion Date/Time: 08/02/2021 10:12 AM Performed by: Tessla Spurling D, CRNA Pre-anesthesia Checklist: Patient identified, Emergency Drugs available, Suction available and Patient being monitored Patient Re-evaluated:Patient Re-evaluated prior to induction Oxygen Delivery Method: Circle system utilized Preoxygenation: Pre-oxygenation with 100% oxygen Induction Type: IV induction Ventilation: Mask ventilation without difficulty LMA: LMA inserted LMA Size: 3.0 Tube type: Oral Number of attempts: 1 Placement Confirmation: positive ETCO2 and breath sounds checked- equal and bilateral Tube secured with: Tape Dental Injury: Teeth and Oropharynx as per pre-operative assessment

## 2021-08-02 NOTE — Anesthesia Preprocedure Evaluation (Addendum)
Anesthesia Evaluation  Patient identified by MRN, date of birth, ID band Patient awake    Reviewed: Allergy & Precautions, NPO status , Patient's Chart, lab work & pertinent test results  Airway Mallampati: II  TM Distance: >3 FB Neck ROM: Full    Dental  (+) Teeth Intact, Dental Advisory Given   Pulmonary neg pulmonary ROS,    breath sounds clear to auscultation       Cardiovascular negative cardio ROS   Rhythm:Regular Rate:Normal     Neuro/Psych negative neurological ROS  negative psych ROS   GI/Hepatic negative GI ROS, Neg liver ROS,   Endo/Other  negative endocrine ROS  Renal/GU negative Renal ROS     Musculoskeletal negative musculoskeletal ROS (+)   Abdominal Normal abdominal exam  (+)  Abdomen: tender.    Peds  Hematology negative hematology ROS (+)   Anesthesia Other Findings   Reproductive/Obstetrics negative OB ROS                           Anesthesia Physical Anesthesia Plan  ASA: 2  Anesthesia Plan: General   Post-op Pain Management:    Induction: Intravenous  PONV Risk Score and Plan: 4 or greater and Ondansetron, Dexamethasone, Midazolam and Scopolamine patch - Pre-op  Airway Management Planned: LMA  Additional Equipment: None  Intra-op Plan:   Post-operative Plan: Extubation in OR  Informed Consent: I have reviewed the patients History and Physical, chart, labs and discussed the procedure including the risks, benefits and alternatives for the proposed anesthesia with the patient or authorized representative who has indicated his/her understanding and acceptance.     Dental advisory given  Plan Discussed with: CRNA  Anesthesia Plan Comments:        Anesthesia Quick Evaluation

## 2021-08-02 NOTE — Transfer of Care (Signed)
Immediate Anesthesia Transfer of Care Note  Patient: Ruth Farmer  Procedure(s) Performed: CYSTOSCOPY/URETEROSCOPY/HOLMIUM LASER/STENT PLACEMENT (Left: Ureter)  Patient Location: PACU  Anesthesia Type:General  Level of Consciousness: awake, alert  and oriented  Airway & Oxygen Therapy: Patient Spontanous Breathing and Patient connected to nasal cannula oxygen  Post-op Assessment: Report given to RN and Post -op Vital signs reviewed and stable  Post vital signs: Reviewed and stable  Last Vitals:  Vitals Value Taken Time  BP 106/77 08/02/21 1104  Temp    Pulse 111 08/02/21 1105  Resp 17 08/02/21 1105  SpO2 94 % 08/02/21 1105  Vitals shown include unvalidated device data.  Last Pain:  Vitals:   08/02/21 0900  TempSrc: Oral  PainSc: 5       Patients Stated Pain Goal: 6 (08/02/21 0900)  Complications: No notable events documented.

## 2021-08-02 NOTE — Op Note (Signed)
Preoperative diagnosis:  1.  Left ureteral calculus  Postoperative diagnosis: 1.  Same  Procedure(s): 1.  Cystoscopy, left retrograde Pyelogram, left ureteroscopy with laser lithotripsy, insertion left JJ stent  Surgeon: Dr. Harold Barban  Anesthesia: General  Complications: Inability to pass flexible scope due to ureteral edema  EBL: Minimal  Specimens: None  Disposition of specimens: Not applicable  Intraoperative findings: Tight distal ureter with bird beaking of the ureter.  I was able to pass semirigid scope over wire through this area and laser stone slightly, stone migrated retrograde to renal pelvis and unable to pass ureteral access sheath due to ureteral edema.  6 [place] by 22 cm JJ stent placed.  Indication: Patient is a 35 year old white female presented with cute onset left-sided flank pain.  She is found to have a 4 mm left mid to proximal ureteral calculus.  She has failure to progress the stone with continued pain presents this time to get a cystoscopy and left ureteroscopy and attempted laser lithotripsy and probable left JJ stent  Description of procedure:  After obtaining informed consent for the patient she was taken the major cystoscopy suite placed under general anesthesia.  She is placed in the dorsolithotomy position genitalia prepped and draped in usual sterile fashion.  Proper pause and timeout was performed for site of procedure.  6 [place] the scope was advanced in the bladder without difficulty.  Bladder progressing normal.  5 French open tip catheter was utilized perform retrograde pyelogram and left side which revealed a filling defect in the left mid ureteral area.  Subsequently passed a sensor wire through the open tip catheter up to the renal pelvis without difficulty.  6.4 French semirigid ureteroscope was then advanced over the guidewire inside the ureteral orifice.  This was advanced into the distal ureter just proximal to the crossing of the iliac  vessels and there was bird beaking of the ureter noted at this point.  I was able to pass the semirigid scope with slight resistance over the guidewire through this area but it was very tight.  The stone was encountered.  I utilized the 200 m holmium laser fiber to attempt fragmentation of the stone at a rate of 6 and 0.6.  Stone fragmented somewhat however it was very mobile in the proximal ureter and I attempted to continue lasering with minimal irrigation to prevent proximal migration of the stone.  Despite best efforts the stone migrated proximally went back to the renal pelvis.  I attempted the pass the semirigid scope retrograde but was somewhat tight and could not reach up to the kidney.  Guidewire was passed through the  semirigid scope up to the kidney and the semirigid scope removed leaving the guidewire in place.  I attempted to gently passed the ureteral access obturator however there was a fair amount of resistance in the area of narrowing in the distal mid ureter.  I did not feel comfortable passing the access sheath due to the resistance met.  It was felt at this point the best plan would be to place ureteral stent, allow passive dilation and return for attempt at repeat ureteroscopy after stent has been in place for 10 to 14 days.  A 6 French by 22 cm Percuflex plus soft Contour stent was then placed through the working channel of the cystoscope and guided inside the left ureter.  This was advanced utilizing the pusher up to the renal pelvis and wire was removed leaving a proximal coil in the renal pelvis  and a distal coil in the bladder.  There was brisk and good flow of urine through and around the stent noted.  Bladder was emptied the procedure terminated.  She was awakened from anesthesia and taken back to the recovery room in stable condition no immediate complication from the procedure.

## 2021-08-02 NOTE — Discharge Instructions (Signed)

## 2021-08-03 ENCOUNTER — Encounter (HOSPITAL_BASED_OUTPATIENT_CLINIC_OR_DEPARTMENT_OTHER): Payer: Self-pay | Admitting: Urology

## 2021-08-05 ENCOUNTER — Other Ambulatory Visit: Payer: Self-pay | Admitting: Urology

## 2021-08-12 ENCOUNTER — Other Ambulatory Visit: Payer: Self-pay

## 2021-08-12 ENCOUNTER — Encounter (HOSPITAL_BASED_OUTPATIENT_CLINIC_OR_DEPARTMENT_OTHER): Payer: Self-pay | Admitting: Urology

## 2021-08-12 NOTE — Progress Notes (Signed)
Spoke w/ via phone for pre-op interview---pt Lab needs dos----    urine poct           Lab results------none COVID test -----patient states asymptomatic no test needed Arrive at -------1000 am 08-16-2021 NPO after MN NO Solid Food.  Clear liquids from MN until---900 am Med rec completed Medications to take morning of surgery -----loratadine Diabetic medication -----n/a Patient instructed no nail polish to be worn day of surgery Patient instructed to bring photo id and insurance card day of surgery Patient aware to have Driver (ride ) / caregiver    spouse Barbara Cower for 24 hours after surgery  Patient Special Instructions -----none Pre-Op special Istructions -----none Patient verbalized understanding of instructions that were given at this phone interview. Patient denies shortness of breath, chest pain, fever, cough at this phone interview.

## 2021-08-15 NOTE — H&P (Signed)
have kidney stones.  HPI: Ruth Farmer is a 35 year-old female patient who is here for renal calculi.    rib cage pain     CC/HPI: Patient is a 35 year old white female with no prior history of stones. Presented to the [facility] emergency room on 07/27/2019 who with acute onset left-sided flank pain with nausea and vomiting. She was found on CT scan of a 4 mm left proximal ureteral calculus with hydronephrosis. She was not placed on tamsulosin both given pain medicines and nausea medicine he is now here for follow-up. She has continued to have severe pain at times currently about 7/10. She has had some nausea and vomiting but no fever or gross hematuria. Patient underwent attempt at left ureteroscopy on 08/02/2021 but stone migrated proximally to the renal pelvis and unable to pass a flexible scope due to ureteral narrowing.  Stent was placed.  Now presents for reattempt at ureteroscopy and laser lithotripsy and stone extraction from the left side.      ALLERGIES: None   MEDICATIONS: Claritin  Elderberry Zinc  Ondansetron Hcl 4 mg tablet  Oxycodone-Acetaminophen  Prenatal     GU PSH: No GU PSH      PSH Notes: wisdom tooth removed   NON-GU PSH: No Non-GU PSH    GU PMH: None   NON-GU PMH: Anxiety    FAMILY HISTORY: Cancer - Father   SOCIAL HISTORY: Marital Status: Married Preferred Language: English; Ethnicity: Not Hispanic Or Latino; Race: White Current Smoking Status: Patient has never smoked.   Tobacco Use Assessment Completed: Used Tobacco in last 30 days? Does not use smokeless tobacco. Does not use drugs. Drinks 2 caffeinated drinks per day. Has not had a blood transfusion.    REVIEW OF SYSTEMS:    GU Review Female:   Patient reports frequent urination and hard to postpone urination. Patient denies burning /pain with urination, get up at night to urinate, leakage of urine, stream starts and stops, trouble starting your stream, have to strain to urinate, and being  pregnant.  Gastrointestinal (Upper):   Patient reports nausea and vomiting. Patient denies indigestion/ heartburn.  Gastrointestinal (Lower):   Patient denies diarrhea and constipation.  Constitutional:   Patient denies fever, night sweats, weight loss, and fatigue.  Skin:   Patient denies skin rash/ lesion and itching.  Eyes:   Patient denies blurred vision and double vision.  Ears/ Nose/ Throat:   Patient denies sore throat and sinus problems.  Hematologic/Lymphatic:   Patient reports easy bruising. Patient denies swollen glands.  Cardiovascular:   Patient denies leg swelling and chest pains.  Respiratory:   Patient denies cough and shortness of breath.  Endocrine:   Patient denies excessive thirst.  Musculoskeletal:   Patient reports back pain. Patient denies joint pain.  Neurological:   Patient denies headaches and dizziness.  Psychologic:   Patient reports anxiety. Patient denies depression.   Notes: uti    VITAL SIGNS:      07/28/2021 11:37 AM  Height 55 in / 139.7 cm  BP 121/80 mmHg  Pulse 91 /min  Temperature 98.0 F / 36.6 C   MULTI-SYSTEM PHYSICAL EXAMINATION:    Constitutional: Well-nourished white female in obvious discomfort.   Neck: Neck symmetrical, not swollen. Normal tracheal position.  Respiratory: No labored breathing, no use of accessory muscles.   Cardiovascular: Normal temperature, normal extremity pulses, no swelling, no varicosities.  Lymphatic: No enlargement of neck, axillae, groin.  Skin: No paleness, no jaundice, no cyanosis. No lesion,  no ulcer, no rash.  Neurologic / Psychiatric: Oriented to time, oriented to place, oriented to person. No depression, no anxiety, no agitation.  Gastrointestinal: No mass, no tenderness, no rigidity, non obese abdomen. Patient has mild-to-moderate left CVA tenderness and mild left lower quadrant tenderness without rebound tenderness.  Eyes: Normal conjunctivae. Normal eyelids.  Ears, Nose, Mouth, and Throat: Left ear no  scars, no lesions, no masses. Right ear no scars, no lesions, no masses. Nose no scars, no lesions, no masses. Normal hearing. Normal lips.  Musculoskeletal: Normal gait and station of head and neck.     Complexity of Data:  Source Of History:  Patient  Records Review:   Previous Doctor Records, Previous Hospital Records  Urine Test Review:   Urinalysis  X-Ray Review: KUB: Reviewed Films. Reviewed Report. Discussed With Patient.  C.T. Abdomen/Pelvis: Reviewed Films. Reviewed Report. Discussed With Patient.     PROCEDURES:         KUB - [id number]  A single view of the abdomen is obtained.      . Patient confirmed No Neulasta OnPro Device. KUB is reviewed today shows no obvious bony abnormalities. She has diffuse bowel gas pattern which makes visualization of stones difficult in the region of the kidneys and upper ureter. There are some calcifications in the pelvis and correlating with the CT pre scan I think there is an additional calcification now on the KUB which may suggest interval distal migration of the left ureteral calculus.          Urinalysis w/Scope - 81001 Dipstick Dipstick Cont'd Micro  Color: Yellow Bilirubin: Neg mg/dL WBC/hpf: 0 - 5/hpf  Appearance: Clear Ketones: 2+ mg/dL RBC/hpf: 0 - 2/hpf  Specific Gravity: 1.025 Blood: 2+ ery/uL Bacteria: NS (Not Seen)  pH: 5.5 Protein: Trace mg/dL Cystals: NS (Not Seen)  Glucose: Neg mg/dL Urobilinogen: 0.2 mg/dL Casts: NS (Not Seen)    Nitrites: Neg Trichomonas: Not Present    Leukocyte Esterase: Neg leu/uL Mucous: Not Present      Epithelial Cells: 0 - 5/hpf      Yeast: Few (1 - 5/hpf)      Sperm: Not Present    Notes: microscopic not concentrated          Ketoralac 60mg  - , [id number] area was prepped and IM ij was giving.pt.tolerated the IJ well.   Qty: 60 Adm. By: [id number]  Unit: mg Lot No Cathe Mons  Route: IM Exp. Date 07/09/2021  Freq: None Mfgr.:   Site: Right Buttock   ASSESSMENT:      ICD-10 Details  1  GU:   Ureteral calculus - N20.1 Acute, Complicated Injury   PLAN:            Medications New Meds: Tamsulosin Hcl 0.4 mg capsule 1 capsule PO Q HS   #15  0 Refill(s)  Oxycodone-Acetaminophen 5 mg-325 mg tablet 1 tablet PO Q 6 H   #15  0 Refill(s)            Orders X-Rays: KUB          Schedule         Document Letter(s):  Created for Patient: Clinical Summary         Notes:   I discussed risks and benefits of left ureteroscopy in detail including risk of anesthesia risk of injury to bladder or ureter or need for additional procedures.  After discussion with the patient agreeable to proceed with reattempt at ureteroscopy after 2 weeks of stent placement.

## 2021-08-16 ENCOUNTER — Ambulatory Visit (HOSPITAL_BASED_OUTPATIENT_CLINIC_OR_DEPARTMENT_OTHER): Payer: BC Managed Care – PPO | Admitting: Certified Registered Nurse Anesthetist

## 2021-08-16 ENCOUNTER — Encounter (HOSPITAL_BASED_OUTPATIENT_CLINIC_OR_DEPARTMENT_OTHER): Payer: Self-pay | Admitting: Urology

## 2021-08-16 ENCOUNTER — Ambulatory Visit (HOSPITAL_BASED_OUTPATIENT_CLINIC_OR_DEPARTMENT_OTHER)
Admission: RE | Admit: 2021-08-16 | Discharge: 2021-08-16 | Disposition: A | Discharge status: Home / Self Care | Payer: BC Managed Care – PPO | Attending: Urology | Admitting: Urology

## 2021-08-16 ENCOUNTER — Other Ambulatory Visit: Payer: Self-pay

## 2021-08-16 ENCOUNTER — Encounter (HOSPITAL_BASED_OUTPATIENT_CLINIC_OR_DEPARTMENT_OTHER)
Admission: RE | Disposition: A | Discharge status: Home / Self Care | Payer: Self-pay | Source: Home / Self Care | Attending: Urology

## 2021-08-16 DIAGNOSIS — N200 Calculus of kidney: Secondary | ICD-10-CM | POA: Diagnosis not present

## 2021-08-16 DIAGNOSIS — N132 Hydronephrosis with renal and ureteral calculous obstruction: Secondary | ICD-10-CM | POA: Diagnosis not present

## 2021-08-16 DIAGNOSIS — Z466 Encounter for fitting and adjustment of urinary device: Secondary | ICD-10-CM | POA: Diagnosis not present

## 2021-08-16 DIAGNOSIS — N201 Calculus of ureter: Secondary | ICD-10-CM | POA: Diagnosis not present

## 2021-08-16 DIAGNOSIS — F419 Anxiety disorder, unspecified: Secondary | ICD-10-CM | POA: Diagnosis not present

## 2021-08-16 HISTORY — PX: CYSTOSCOPY/URETEROSCOPY/HOLMIUM LASER/STENT PLACEMENT: SHX6546

## 2021-08-16 LAB — POCT PREGNANCY, URINE: Preg Test, Ur: NEGATIVE

## 2021-08-16 SURGERY — CYSTOSCOPY/URETEROSCOPY/HOLMIUM LASER/STENT PLACEMENT
Anesthesia: General | Site: Ureter | Laterality: Left

## 2021-08-16 MED ORDER — KETOROLAC TROMETHAMINE 30 MG/ML IJ SOLN
INTRAMUSCULAR | Status: DC | PRN
  Administered 2021-08-16: 30 mg via INTRAVENOUS

## 2021-08-16 MED ORDER — FENTANYL CITRATE (PF) 100 MCG/2ML IJ SOLN
INTRAMUSCULAR | Status: DC | PRN
  Administered 2021-08-16 (×2): 50 ug via INTRAVENOUS

## 2021-08-16 MED ORDER — SODIUM CHLORIDE 0.9 % IR SOLN
Status: DC | PRN
  Administered 2021-08-16: 3000 mL

## 2021-08-16 MED ORDER — PROPOFOL 10 MG/ML IV BOLUS
INTRAVENOUS | Status: AC
  Filled 2021-08-16: qty 20

## 2021-08-16 MED ORDER — FENTANYL CITRATE (PF) 100 MCG/2ML IJ SOLN: 25.0000 ug | INTRAMUSCULAR | Status: DC | PRN

## 2021-08-16 MED ORDER — ONDANSETRON HCL 4 MG/2ML IJ SOLN
INTRAMUSCULAR | Status: DC | PRN
  Administered 2021-08-16: 4 mg via INTRAVENOUS

## 2021-08-16 MED ORDER — CEFAZOLIN SODIUM-DEXTROSE 2-4 GM/100ML-% IV SOLN
INTRAVENOUS | Status: AC
  Filled 2021-08-16: qty 100

## 2021-08-16 MED ORDER — AMISULPRIDE (ANTIEMETIC) 5 MG/2ML IV SOLN: 10.0000 mg | Freq: Once | INTRAVENOUS | Status: DC | PRN

## 2021-08-16 MED ORDER — DEXAMETHASONE SODIUM PHOSPHATE 4 MG/ML IJ SOLN
INTRAMUSCULAR | Status: DC | PRN
  Administered 2021-08-16: 10 mg via INTRAVENOUS

## 2021-08-16 MED ORDER — KETOROLAC TROMETHAMINE 30 MG/ML IJ SOLN
INTRAMUSCULAR | Status: AC
  Filled 2021-08-16: qty 1

## 2021-08-16 MED ORDER — FENTANYL CITRATE (PF) 100 MCG/2ML IJ SOLN
INTRAMUSCULAR | Status: AC
  Filled 2021-08-16: qty 2

## 2021-08-16 MED ORDER — DEXAMETHASONE SODIUM PHOSPHATE 10 MG/ML IJ SOLN
INTRAMUSCULAR | Status: AC
  Filled 2021-08-16: qty 1

## 2021-08-16 MED ORDER — LIDOCAINE HCL (CARDIAC) PF 100 MG/5ML IV SOSY
PREFILLED_SYRINGE | INTRAVENOUS | Status: DC | PRN
  Administered 2021-08-16: 80 mg via INTRAVENOUS

## 2021-08-16 MED ORDER — PROPOFOL 10 MG/ML IV BOLUS
INTRAVENOUS | Status: DC | PRN
  Administered 2021-08-16: 200 mg via INTRAVENOUS

## 2021-08-16 MED ORDER — LACTATED RINGERS IV SOLN: INTRAVENOUS | Status: DC

## 2021-08-16 MED ORDER — ONDANSETRON HCL 4 MG/2ML IJ SOLN: 4.0000 mg | Freq: Once | INTRAMUSCULAR | Status: DC | PRN

## 2021-08-16 MED ORDER — IOHEXOL 300 MG/ML  SOLN
INTRAMUSCULAR | Status: DC | PRN
  Administered 2021-08-16: 10 mL

## 2021-08-16 MED ORDER — OXYCODONE HCL 5 MG/5ML PO SOLN: 5.0000 mg | Freq: Once | ORAL | Status: DC | PRN

## 2021-08-16 MED ORDER — OXYCODONE HCL 5 MG PO TABS: 5.0000 mg | ORAL_TABLET | Freq: Once | ORAL | Status: DC | PRN

## 2021-08-16 MED ORDER — CEFAZOLIN SODIUM-DEXTROSE 2-4 GM/100ML-% IV SOLN
2.0000 g | INTRAVENOUS | Status: AC
  Administered 2021-08-16: 2 g via INTRAVENOUS

## 2021-08-16 MED ORDER — MIDAZOLAM HCL 5 MG/5ML IJ SOLN
INTRAMUSCULAR | Status: DC | PRN
  Administered 2021-08-16: 2 mg via INTRAVENOUS

## 2021-08-16 MED ORDER — MIDAZOLAM HCL 2 MG/2ML IJ SOLN
INTRAMUSCULAR | Status: AC
  Filled 2021-08-16: qty 2

## 2021-08-16 SURGICAL SUPPLY — 31 items
BAG DRAIN URO-CYSTO SKYTR STRL (DRAIN) ×2 IMPLANT
BAG DRN UROCATH (DRAIN) ×1
BASKET STONE 1.7 NGAGE (UROLOGICAL SUPPLIES) ×2 IMPLANT
BULB IRRIG PATHFIND (MISCELLANEOUS) ×2 IMPLANT
CATH URET 5FR 28IN OPEN ENDED (CATHETERS) ×2 IMPLANT
CLOTH BEACON ORANGE TIMEOUT ST (SAFETY) ×2 IMPLANT
EXTRACTOR STONE 1.7FRX115CM (UROLOGICAL SUPPLIES) IMPLANT
FIBER LASER FLEXIVA 365 (UROLOGICAL SUPPLIES) IMPLANT
GLOVE SURG ENC MOIS LTX SZ7.5 (GLOVE) ×2 IMPLANT
GLOVE SURG UNDER POLY LF SZ6 (GLOVE) ×2 IMPLANT
GLOVE SURG UNDER POLY LF SZ7 (GLOVE) ×2 IMPLANT
GOWN STRL REUS W/TWL LRG LVL3 (GOWN DISPOSABLE) ×4 IMPLANT
GOWN STRL REUS W/TWL XL LVL3 (GOWN DISPOSABLE) ×2 IMPLANT
GUIDEWIRE STR DUAL SENSOR (WIRE) ×2 IMPLANT
GUIDEWIRE ZIPWRE .038 STRAIGHT (WIRE) IMPLANT
IV NS 1000ML (IV SOLUTION) ×2
IV NS 1000ML BAXH (IV SOLUTION) ×1 IMPLANT
IV NS IRRIG 3000ML ARTHROMATIC (IV SOLUTION) ×2 IMPLANT
KIT TURNOVER CYSTO (KITS) ×2 IMPLANT
MANIFOLD NEPTUNE II (INSTRUMENTS) ×2 IMPLANT
NS IRRIG 500ML POUR BTL (IV SOLUTION) ×2 IMPLANT
PACK CYSTO (CUSTOM PROCEDURE TRAY) ×2 IMPLANT
SHEATH URETERAL 12FRX28CM (UROLOGICAL SUPPLIES) ×2 IMPLANT
STENT URET 6FRX22 CONTOUR (STENTS) ×2 IMPLANT
STENT URET 6FRX26 CONTOUR (STENTS) IMPLANT
SYR 10ML LL (SYRINGE) ×2 IMPLANT
SYR 20ML LL LF (SYRINGE) ×2 IMPLANT
TRACTIP FLEXIVA PULS ID 200XHI (Laser) IMPLANT
TRACTIP FLEXIVA PULSE ID 200 (Laser)
TUBE CONNECTING 12X1/4 (SUCTIONS) IMPLANT
TUBING UROLOGY SET (TUBING) ×2 IMPLANT

## 2021-08-16 NOTE — Interval H&P Note (Signed)
History and Physical Interval Note:  08/16/2021 11:36 AM  Ruth Farmer  has presented today for surgery, with the diagnosis of LEFT URETERAL CALCULUS.  The various methods of treatment have been discussed with the patient and family. After consideration of risks, benefits and other options for treatment, the patient has consented to  Procedure(s) with comments: CYSTOSCOPY/URETEROSCOPY/HOLMIUM LASER/STENT EXCHANGE (Left) - 1 HR as a surgical intervention.  The patient's history has been reviewed, patient examined, no change in status, stable for surgery.  I have reviewed the patient's chart and labs.  Questions were answered to the patient's satisfaction.     Belva Agee

## 2021-08-16 NOTE — Transfer of Care (Signed)
Immediate Anesthesia Transfer of Care Note  Patient: Ruth Farmer  Procedure(s) Performed: CYSTOSCOPY/URETEROSCOPY WITH STONE EXTRACTION AND STENT EXCHANGE (Left: Ureter)  Patient Location: PACU  Anesthesia Type:General  Level of Consciousness: awake, alert  and oriented  Airway & Oxygen Therapy: Patient Spontanous Breathing and Patient connected to nasal cannula oxygen  Post-op Assessment: Report given to RN and Post -op Vital signs reviewed and stable  Post vital signs: Reviewed and stable  Last Vitals:  Vitals Value Taken Time  BP    Temp    Pulse 90 08/16/21 1253  Resp 9 08/16/21 1253  SpO2 100 % 08/16/21 1253  Vitals shown include unvalidated device data.  Last Pain:  Vitals:   08/16/21 1037  TempSrc: Oral  PainSc: 0-No pain      Patients Stated Pain Goal: 5 (08/16/21 1037)  Complications: No notable events documented.

## 2021-08-16 NOTE — Anesthesia Preprocedure Evaluation (Addendum)
Anesthesia Evaluation  Patient identified by MRN, date of birth, ID band Patient awake    Reviewed: Allergy & Precautions, NPO status , Patient's Chart, lab work & pertinent test results  History of Anesthesia Complications Negative for: history of anesthetic complications  Airway Mallampati: II  TM Distance: >3 FB Neck ROM: Full    Dental  (+) Dental Advisory Given, Teeth Intact   Pulmonary neg pulmonary ROS,    Pulmonary exam normal        Cardiovascular negative cardio ROS Normal cardiovascular exam     Neuro/Psych negative neurological ROS     GI/Hepatic negative GI ROS, Neg liver ROS,   Endo/Other  negative endocrine ROS  Renal/GU Renal disease (LEFT URETERAL CALCULUS)  negative genitourinary   Musculoskeletal negative musculoskeletal ROS (+)   Abdominal   Peds  Hematology negative hematology ROS (+)   Anesthesia Other Findings   Reproductive/Obstetrics                            Anesthesia Physical Anesthesia Plan  ASA: 2  Anesthesia Plan: General   Post-op Pain Management:    Induction: Intravenous  PONV Risk Score and Plan: 3 and Ondansetron, Dexamethasone, Midazolam and Treatment may vary due to age or medical condition  Airway Management Planned: LMA  Additional Equipment: None  Intra-op Plan:   Post-operative Plan: Extubation in OR  Informed Consent: I have reviewed the patients History and Physical, chart, labs and discussed the procedure including the risks, benefits and alternatives for the proposed anesthesia with the patient or authorized representative who has indicated his/her understanding and acceptance.     Dental advisory given  Plan Discussed with:   Anesthesia Plan Comments:         Anesthesia Quick Evaluation

## 2021-08-16 NOTE — Discharge Instructions (Signed)
CYSTOSCOPY HOME CARE INSTRUCTIONS  Activity: Rest for the remainder of the day.  Do not drive or operate equipment today.  You may resume normal activities in one to two days as instructed by your physician.   Meals: Drink plenty of liquids and eat light foods such as gelatin or soup this evening.  You may return to a normal meal plan tomorrow.  Return to Work: You may return to work in one to two days or as instructed by your physician.  Special Instructions / Symptoms: Call your physician if any of these symptoms occur:   -persistent or heavy bleeding  -bleeding which continues after first few urination  -large blood clots that are difficult to pass  -urine stream diminishes or stops completely  -fever equal to or higher than 101 degrees Farenheit.  -cloudy urine with a strong, foul odor  -severe pain  Females should always wipe from front to back after elimination.  You may feel some burning pain when you urinate.  This should disappear with time.  Applying moist heat to the lower abdomen or a hot tub bath may help relieve the pain. \  Follow-Up / Date of Return Visit to Your Physician:  as instructed Call for an appointment to arrange follow-up.   Post Anesthesia Home Care Instructions  Activity: Get plenty of rest for the remainder of the day. A responsible individual must stay with you for 24 hours following the procedure.  For the next 24 hours, DO NOT: -Drive a car -Operate machinery -Drink alcoholic beverages -Take any medication unless instructed by your physician -Make any legal decisions or sign important papers.  Meals: Start with liquid foods such as gelatin or soup. Progress to regular foods as tolerated. Avoid greasy, spicy, heavy foods. If nausea and/or vomiting occur, drink only clear liquids until the nausea and/or vomiting subsides. Call your physician if vomiting continues.  Special Instructions/Symptoms: Your throat may feel dry or sore from the  anesthesia or the breathing tube placed in your throat during surgery. If this causes discomfort, gargle with warm salt water. The discomfort should disappear within 24 hours.  If you had a scopolamine patch placed behind your ear for the management of post- operative nausea and/or vomiting:  1. The medication in the patch is effective for 72 hours, after which it should be removed.  Wrap patch in a tissue and discard in the trash. Wash hands thoroughly with soap and water. 2. You may remove the patch earlier than 72 hours if you experience unpleasant side effects which may include dry mouth, dizziness or visual disturbances. 3. Avoid touching the patch. Wash your hands with soap and water after contact with the patch.     

## 2021-08-16 NOTE — Op Note (Signed)
Preoperative diagnosis:  1.  Left renal calculus  Postoperative diagnosis: 1.  Left renal calculus  Procedure(s): 1.  Cystoscopy, left retrograde pyelogram, left ureteroscopy and pyeloscopy with stone extraction, left JJ stent exchange  Surgeon: Dr. Karoline Caldwell  Anesthesia: General  Complications: None  EBL: Minimal  Specimens: Kidney stone  Disposition of specimens: With patient  Intraoperative findings: 4 to 5 mm left renal calculus in the lower calyx.  Able to extract utilizing an engage basket.  6 [place] by 22 cm Percuflex plus soft Contour stent exchanged with tether coming to outside the urethra  Indication: Patient is a 35 year old white female with history of 4 mm left proximal ureteral calculus.  Initial attempt at ureteroscopy and laser lithotripsy resulted in stone dislodging retrograde into the lower calyx of the left kidney and unable to pass flexible scope due to ureteral edema at the stone lodgment site.  She has had an indwelling stent now for 2 weeks and presents at this time to go to reattempt at ureteroscopy and stone extraction.  Description of procedure:  After obtaining informed consent for the patient she was taken major cystoscopy suite placed under general anesthesia.  Placed in the dorsolithotomy position genitalia prepped and draped in usual sterile fashion.  Proper pause and timeout was performed for site of procedure.  21 [place] was advanced in the bladder without difficulty.  Left JJ stent was identified and grasped utilizing alligator graspers.  This was pulled just beyond the urethral meatus and a guidewire was fed up through the stent and manipulated up to the renal pelvis without difficulty.  The JJ stent was removed leaving the guidewire in place.  The semirigid ureteroscope was then advanced alongside the guidewire manipulated easily inside the left ureteral orifice alongside the guidewire was advanced along its length and no stone was encountered  up to the UPJ.  The ureteroscope was removed.  The small length ureteral access sheath was then utilized to cannulate the left ureteral orifice.  I encountered some resistance approximately 3 to 4 cm inside the left ureteral orifice and therefore stopped at this point and remove the obturator.  The flexible digital scope was then advanced through the access sheath and I was able to manipulate this up the ureter to the renal pelvis.  The stone was noted in the lower pole calyx.  Utilizing the engage basket I was able to entrap and subsequently extract the stone without difficulty along the length without utilizing the laser.  Semirigid ureteroscope was then advanced again just inside the left ureteral orifice and a retrograde pyelogram was performed to the scope revealed good filling of the ureter with no evidence of filling defect or extravasation of contrast.  Because of the manipulation utilized the access sheath I felt this stent would be prudent.  A guidewire was again passed up to the renal pelvis.  This was then back fed through the cystoscope and a 6 [place] by 22 cm Percuflex plus soft Contour stent was placed leaving a proximal coil in the renal pelvis and a distal coil in the bladder.  A tether was left on the distal end and stent left just beyond the urethral meatus to facilitate future removal.  Procedure was terminated she was awakened from anesthesia and taken back to the recovery room in stable condition.  No immediate complication from the procedure.

## 2021-08-16 NOTE — Anesthesia Procedure Notes (Signed)
Procedure Name: LMA Insertion Date/Time: 08/16/2021 12:02 PM Performed by: Cleda Clarks, CRNA Pre-anesthesia Checklist: Patient identified, Emergency Drugs available, Suction available and Patient being monitored Patient Re-evaluated:Patient Re-evaluated prior to induction Oxygen Delivery Method: Circle system utilized Preoxygenation: Pre-oxygenation with 100% oxygen Induction Type: IV induction Ventilation: Mask ventilation without difficulty LMA: LMA inserted LMA Size: 4.0 Number of attempts: 1 Placement Confirmation: positive ETCO2 Tube secured with: Tape Dental Injury: Teeth and Oropharynx as per pre-operative assessment

## 2021-08-16 NOTE — Anesthesia Postprocedure Evaluation (Signed)
Anesthesia Post Note  Patient: Ruth Farmer  Procedure(s) Performed: CYSTOSCOPY/URETEROSCOPY WITH STONE EXTRACTION AND STENT EXCHANGE (Left: Ureter)     Patient location during evaluation: PACU Anesthesia Type: General Level of consciousness: awake and alert Pain management: pain level controlled Vital Signs Assessment: post-procedure vital signs reviewed and stable Respiratory status: spontaneous breathing, nonlabored ventilation and respiratory function stable Cardiovascular status: blood pressure returned to baseline and stable Postop Assessment: no apparent nausea or vomiting Anesthetic complications: no   No notable events documented.  Last Vitals:  Vitals:   08/16/21 1300 08/16/21 1315  BP: 118/89 115/72  Pulse: (!) 102 86  Resp: 19 17  Temp:    SpO2: 100% 98%    Last Pain:  Vitals:   08/16/21 1315  TempSrc:   PainSc: 0-No pain                 [name]

## 2021-08-17 ENCOUNTER — Encounter (HOSPITAL_BASED_OUTPATIENT_CLINIC_OR_DEPARTMENT_OTHER): Payer: Self-pay | Admitting: Urology

## 2021-08-25 DIAGNOSIS — N201 Calculus of ureter: Secondary | ICD-10-CM | POA: Diagnosis not present

## 2021-09-12 DIAGNOSIS — N201 Calculus of ureter: Secondary | ICD-10-CM | POA: Diagnosis not present

## 2022-02-24 DIAGNOSIS — Z87442 Personal history of urinary calculi: Secondary | ICD-10-CM | POA: Diagnosis not present

## 2022-02-24 DIAGNOSIS — R109 Unspecified abdominal pain: Secondary | ICD-10-CM | POA: Diagnosis not present

## 2022-02-24 DIAGNOSIS — R1084 Generalized abdominal pain: Secondary | ICD-10-CM | POA: Diagnosis not present

## 2022-08-04 ENCOUNTER — Telehealth: Payer: Self-pay | Admitting: *Deleted

## 2022-08-04 NOTE — Chronic Care Management (AMB) (Signed)
  Care Coordination  Outreach Note  08/04/2022 Name: Ruth Farmer MRN: [id number] DOB: 1986/03/11   Care Coordination Outreach Attempts  An unsuccessful telephone outreach was attempted today to offer the patient information about available care coordination services as a benefit of their health plan.   Follow Up Plan:  Additional outreach attempts will be made to offer the patient care coordination information and services.   Encounter Outcome:  No Answer  Gwenevere Ghazi  Care Coordination Care Guide  Direct Dial: [phone]

## 2022-08-08 NOTE — Chronic Care Management (AMB) (Signed)
  Care Coordination  Outreach Note  08/08/2022 Name: Ruth Farmer MRN: [id number] DOB: Aug 03, 1986   Care Coordination Outreach Attempts  A second unsuccessful outreach was attempted today to offer the patient with information about available care coordination services as a benefit of their health plan.     Follow Up Plan:  Additional outreach attempts will be made to offer the patient care coordination information and services.   Encounter Outcome:  No Answer  Christie Nottingham  Care Coordination Care Guide  Direct Dial: [phone]

## 2022-08-17 NOTE — Chronic Care Management (AMB) (Signed)
  Care Coordination  Outreach Note  08/17/2022 Name: Ruth Farmer MRN: [id number] DOB: September 14, 1986   Care Coordination Outreach Attempts  A third unsuccessful outreach was attempted today to offer the patient with information about available care coordination services as a benefit of their health plan.   Follow Up Plan:  No further outreach attempts will be made at this time. We have been unable to contact the patient to offer or enroll patient in care coordination services  Encounter Outcome:  No Answer  Gwenevere Ghazi  Care Coordination Care Guide  Direct Dial: [phone]

## 2023-01-18 IMAGING — CT CT RENAL STONE PROTOCOL
2 of 4 series · 17 of 46 positions shown, 19 images · non-contrast
Comparison: 11/27/2006

CLINICAL DATA: Left flank pain

EXAM:
CT ABDOMEN AND PELVIS WITHOUT CONTRAST
TECHNIQUE: Multidetector CT imaging of the abdomen and pelvis was performed
following the standard protocol without IV contrast.

[Series 3: renal stone 5.0 · axial · 0.75mm/px · z∈[+264,+669]mm · 14 of 89 slices shown, 16 images]
[im 4/89  soft-tissue]
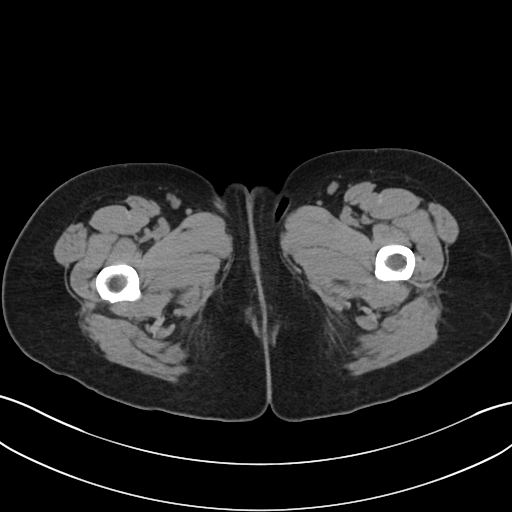
[im 4/89  bone]
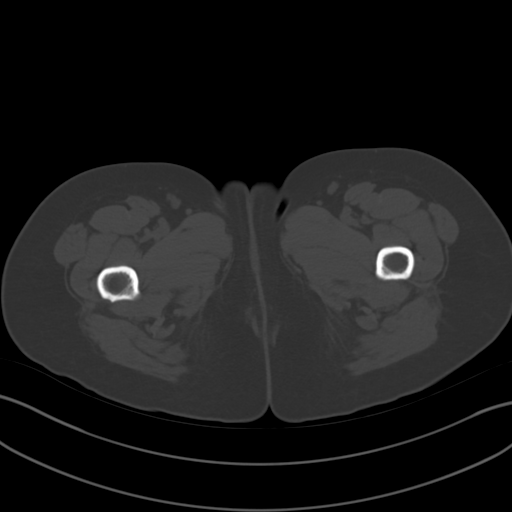
[im 12/89  soft-tissue]
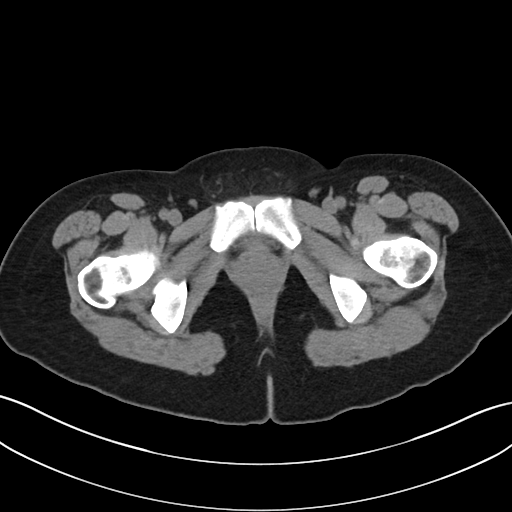
[im 16/89  soft-tissue]
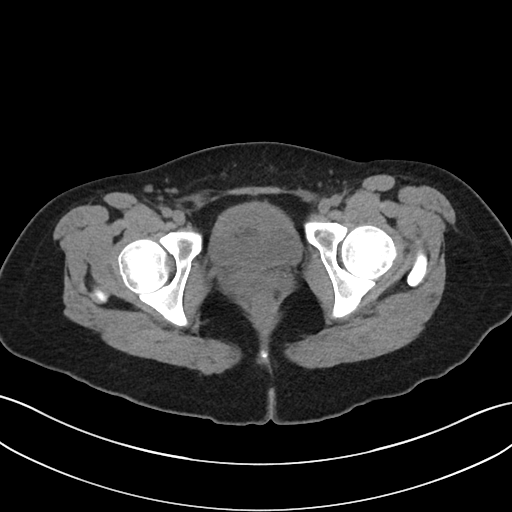
[im 23/89  soft-tissue]
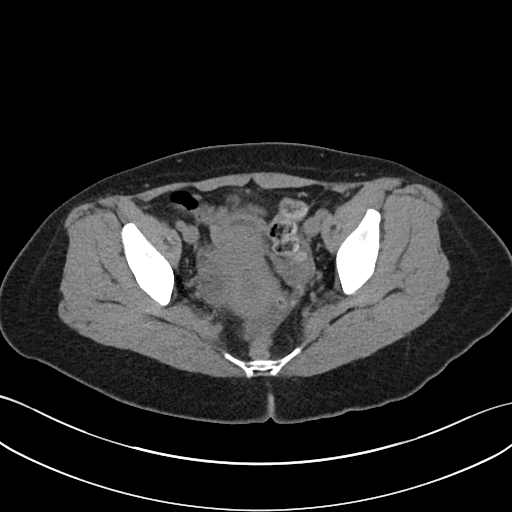
[im 31/89  soft-tissue]
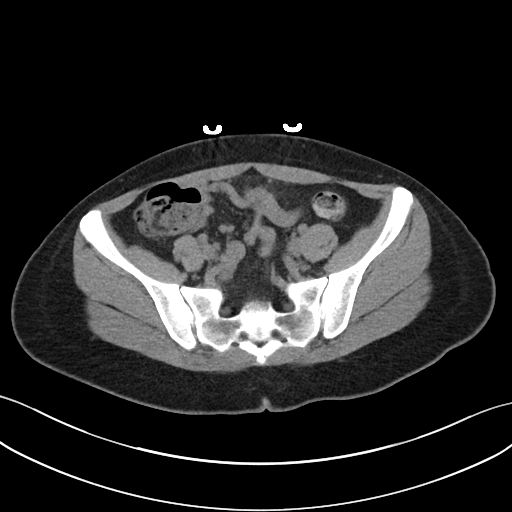
[im 35/89  soft-tissue]
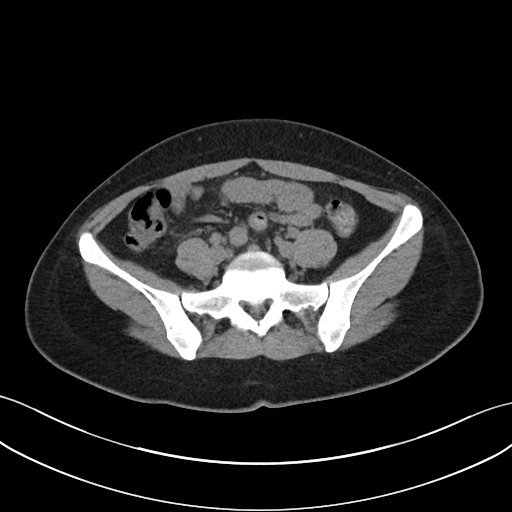
[im 43/89  soft-tissue]
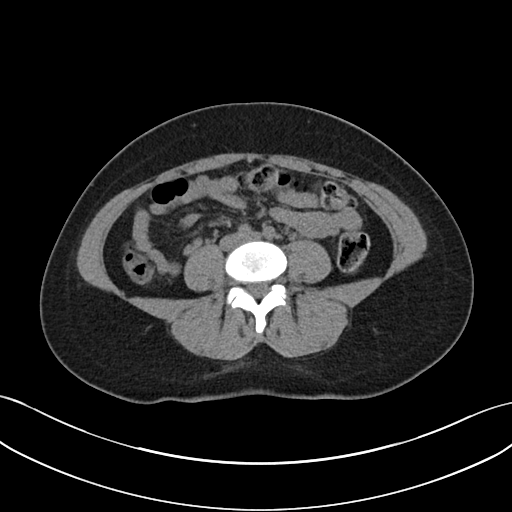
[im 46/89  soft-tissue]
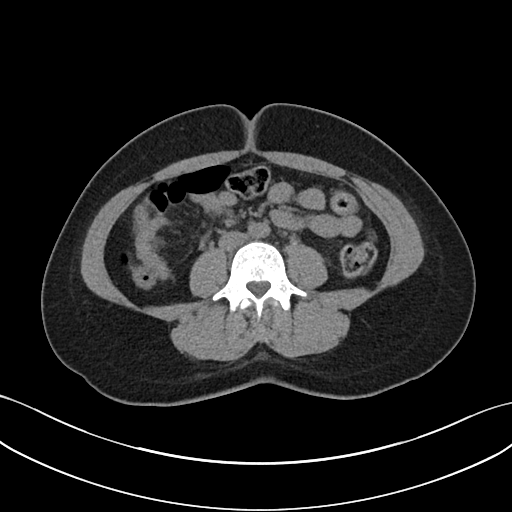
[im 54/89  soft-tissue]
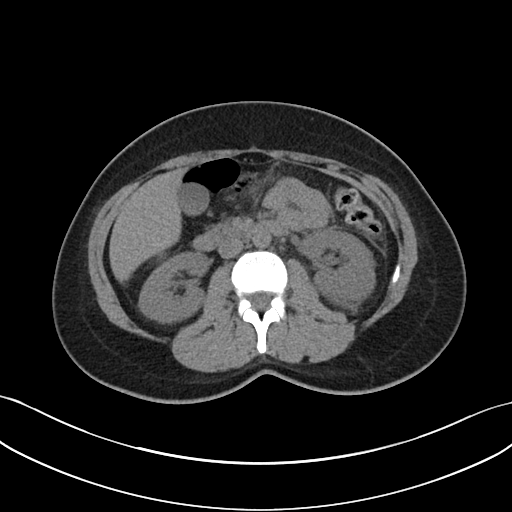
[im 54/89  bone]
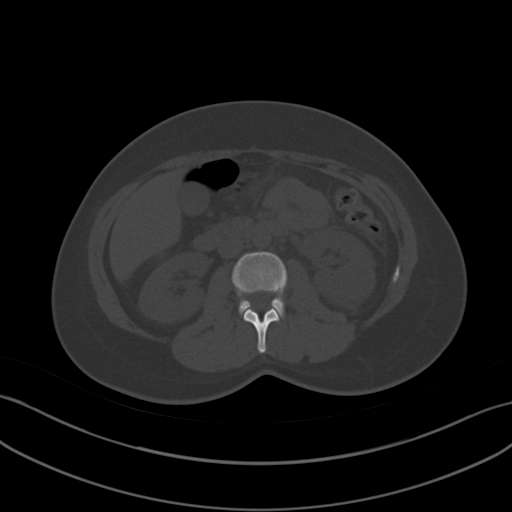
[im 58/89  soft-tissue]
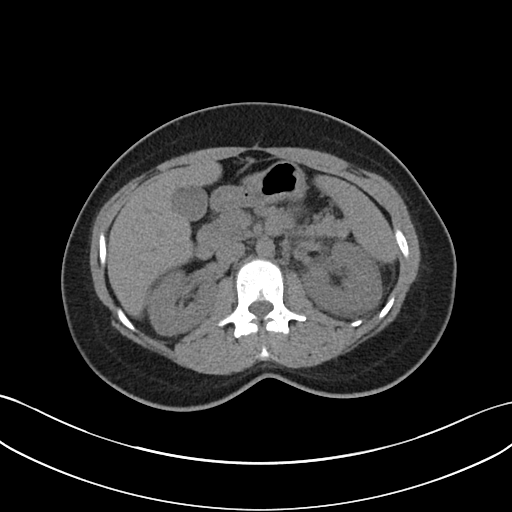
[im 66/89  soft-tissue]
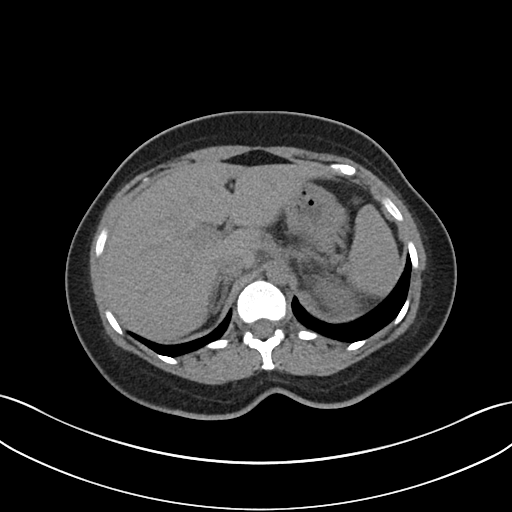
[im 73/89  soft-tissue]
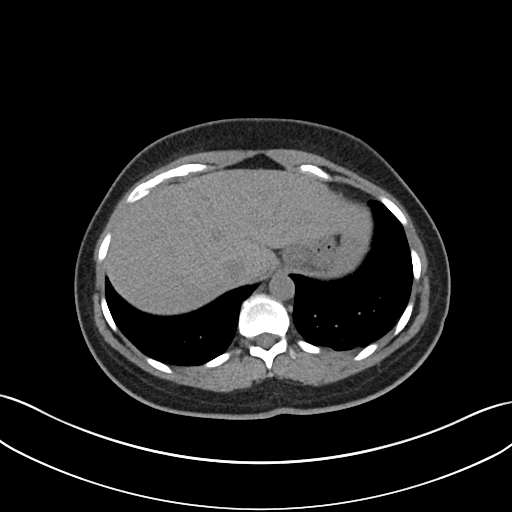
[im 77/89  soft-tissue]
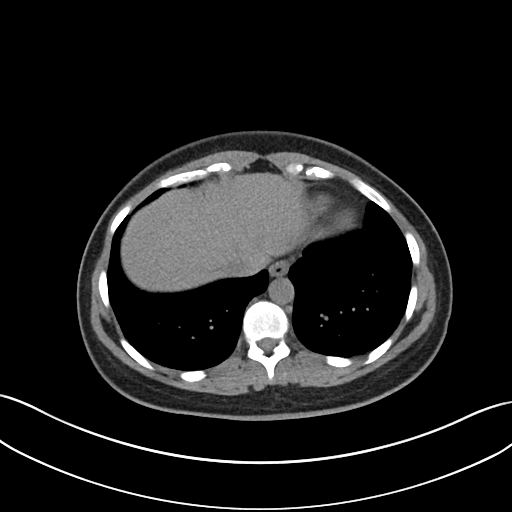
[im 85/89  soft-tissue]
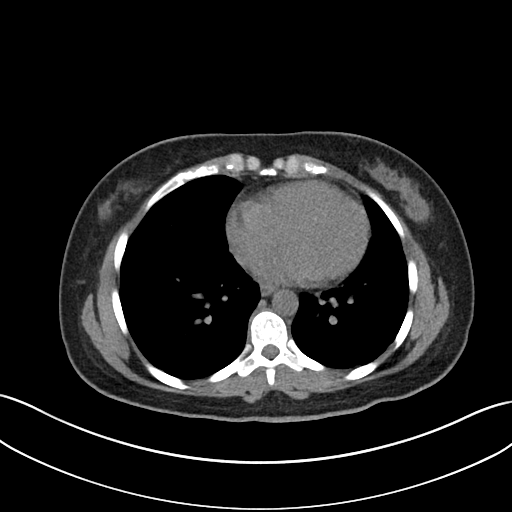

[Series 6: cor · coronal · 0.78mm/px · 3 of 92 slices shown]
[im 31/92  soft-tissue]
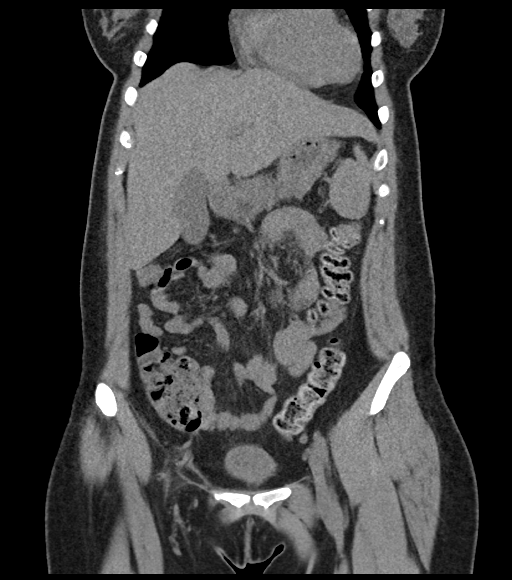
[im 41/92  soft-tissue]
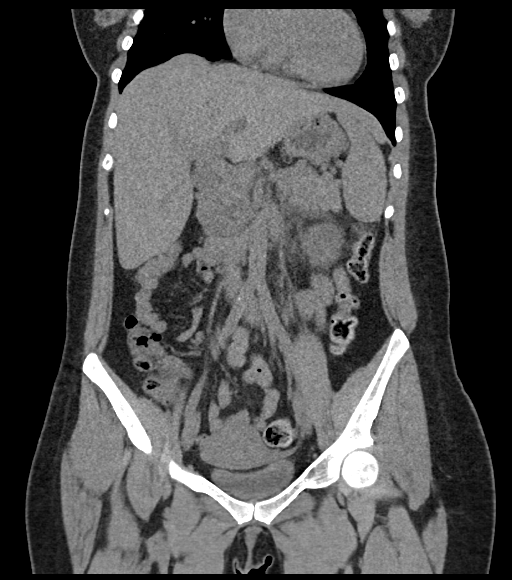
[im 51/92  soft-tissue]
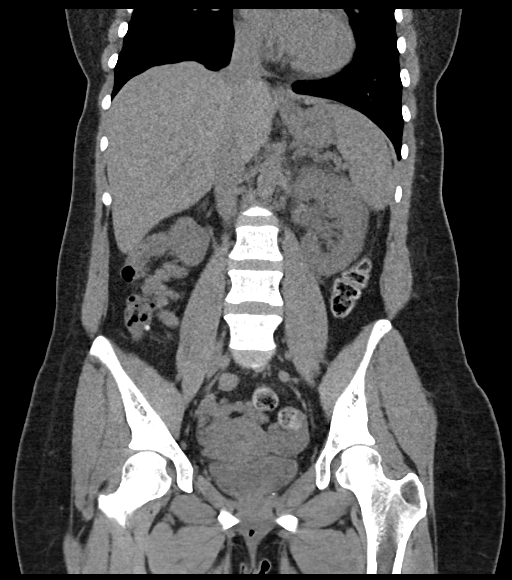

[17 of 46 positions shown; findings below may reference images not displayed]

FINDINGS: Lower chest: Linear subsegmental atelectasis in the posterior basal
segments of both lower lobes.

Hepatobiliary: Unremarkable

Pancreas: Unremarkable

Spleen: Unremarkable

Adrenals/Urinary Tract: Both adrenal glands appear normal.

There is asymmetric left perinephric stranding and mild left
hydronephrosis related to a 0.4 cm left proximal ureteral calculus
shown on image 43 series 3. Ureteral caliber distal to this calculus
is normal. No additional significant urinary tract calculi
identified.

Stomach/Bowel: Unremarkable

Vascular/Lymphatic: Unremarkable

Reproductive: Unremarkable

Other: Trace free pelvic fluid, probably physiologic.

Musculoskeletal: Unremarkable
IMPRESSION: 1. Asymmetric left perinephric stranding and mild left
hydronephrosis caused by a 4 mm left proximal ureteral calculus. No
other calculi identified.
2. Trace free pelvic fluid, likely physiologic.
